# Patient Record
Sex: Female | Born: 1970 | Race: White | Hispanic: No | Marital: Married | Smoking: Former smoker
Health system: Southern US, Community
[De-identification: ages and names within clinical notes are randomized; demographics above are authoritative.]

## PROBLEM LIST (undated history)

## (undated) DIAGNOSIS — I10 Essential (primary) hypertension: Secondary | ICD-10-CM

## (undated) DIAGNOSIS — K219 Gastro-esophageal reflux disease without esophagitis: Secondary | ICD-10-CM

## (undated) DIAGNOSIS — K589 Irritable bowel syndrome without diarrhea: Secondary | ICD-10-CM

## (undated) HISTORY — DX: Irritable bowel syndrome, unspecified: K58.9

## (undated) HISTORY — PX: ABDOMINAL HYSTERECTOMY: SHX81

---

## 1994-12-01 HISTORY — PX: PARTIAL HYSTERECTOMY: SHX80

## 2004-11-28 ENCOUNTER — Ambulatory Visit: Payer: Self-pay

## 2004-12-05 ENCOUNTER — Ambulatory Visit: Payer: Self-pay

## 2005-12-02 ENCOUNTER — Ambulatory Visit: Payer: Self-pay

## 2007-10-27 ENCOUNTER — Ambulatory Visit: Payer: Self-pay

## 2007-11-19 ENCOUNTER — Ambulatory Visit: Payer: Self-pay | Admitting: Gastroenterology

## 2009-06-27 ENCOUNTER — Ambulatory Visit: Payer: Self-pay

## 2009-07-06 ENCOUNTER — Ambulatory Visit: Payer: Self-pay

## 2009-07-17 ENCOUNTER — Ambulatory Visit: Payer: Self-pay

## 2010-01-08 ENCOUNTER — Ambulatory Visit: Payer: Self-pay | Admitting: General Surgery

## 2011-05-30 ENCOUNTER — Ambulatory Visit: Payer: Self-pay | Admitting: Gastroenterology

## 2011-05-30 HISTORY — PX: UPPER GI ENDOSCOPY: SHX6162

## 2011-05-30 HISTORY — PX: COLONOSCOPY W/ BIOPSIES: SHX1374

## 2011-06-03 LAB — PATHOLOGY REPORT

## 2011-07-17 ENCOUNTER — Ambulatory Visit: Payer: Self-pay

## 2012-11-22 ENCOUNTER — Ambulatory Visit: Payer: Self-pay

## 2013-09-27 ENCOUNTER — Ambulatory Visit: Payer: Self-pay

## 2013-10-06 ENCOUNTER — Emergency Department (HOSPITAL_COMMUNITY): Payer: Managed Care, Other (non HMO)

## 2013-10-06 ENCOUNTER — Encounter (HOSPITAL_COMMUNITY): Payer: Self-pay | Admitting: Emergency Medicine

## 2013-10-06 ENCOUNTER — Emergency Department (HOSPITAL_COMMUNITY)
Admission: EM | Admit: 2013-10-06 | Discharge: 2013-10-06 | Disposition: A | Discharge status: Home / Self Care | Payer: Managed Care, Other (non HMO) | Attending: Emergency Medicine | Admitting: Emergency Medicine

## 2013-10-06 DIAGNOSIS — Z8639 Personal history of other endocrine, nutritional and metabolic disease: Secondary | ICD-10-CM | POA: Insufficient documentation

## 2013-10-06 DIAGNOSIS — I10 Essential (primary) hypertension: Secondary | ICD-10-CM | POA: Insufficient documentation

## 2013-10-06 DIAGNOSIS — K219 Gastro-esophageal reflux disease without esophagitis: Secondary | ICD-10-CM | POA: Insufficient documentation

## 2013-10-06 DIAGNOSIS — Z79899 Other long term (current) drug therapy: Secondary | ICD-10-CM | POA: Insufficient documentation

## 2013-10-06 DIAGNOSIS — R1013 Epigastric pain: Secondary | ICD-10-CM | POA: Insufficient documentation

## 2013-10-06 DIAGNOSIS — Z862 Personal history of diseases of the blood and blood-forming organs and certain disorders involving the immune mechanism: Secondary | ICD-10-CM | POA: Insufficient documentation

## 2013-10-06 DIAGNOSIS — R0789 Other chest pain: Secondary | ICD-10-CM | POA: Insufficient documentation

## 2013-10-06 HISTORY — DX: Gastro-esophageal reflux disease without esophagitis: K21.9

## 2013-10-06 HISTORY — DX: Essential (primary) hypertension: I10

## 2013-10-06 LAB — CBC
HCT: 40.1 % (ref 36.0–46.0)
Hemoglobin: 13.3 g/dL (ref 12.0–15.0)
MCH: 27.6 pg (ref 26.0–34.0)
MCHC: 33.2 g/dL (ref 30.0–36.0)
MCV: 83.2 fL (ref 78.0–100.0)
Platelets: 330 10*3/uL (ref 150–400)
RBC: 4.82 MIL/uL (ref 3.87–5.11)
RDW: 14 % (ref 11.5–15.5)
WBC: 11.8 10*3/uL — ABNORMAL HIGH (ref 4.0–10.5)

## 2013-10-06 LAB — BASIC METABOLIC PANEL
BUN: 16 mg/dL (ref 6–23)
CO2: 28 mEq/L (ref 19–32)
Calcium: 9.9 mg/dL (ref 8.4–10.5)
Chloride: 96 mEq/L (ref 96–112)
Creatinine, Ser: 0.55 mg/dL (ref 0.50–1.10)
GFR calc Af Amer: >90 mL/min (ref >90)
GFR calc non Af Amer: >90 mL/min (ref >90)
Glucose, Bld: 126 mg/dL — ABNORMAL HIGH (ref 70–99)
Potassium: 3.3 mEq/L — ABNORMAL LOW (ref 3.5–5.1)
Sodium: 135 mEq/L (ref 135–145)

## 2013-10-06 LAB — HEPATIC FUNCTION PANEL
Bilirubin, Direct: 0.1 mg/dL (ref 0.0–0.3)
Indirect Bilirubin: 0.2 mg/dL — ABNORMAL LOW (ref 0.3–0.9)

## 2013-10-06 LAB — POCT I-STAT TROPONIN I
Troponin i, poc: 0.01 ng/mL (ref 0.00–0.08)
Troponin i, poc: 0 ng/mL (ref 0.00–0.08)

## 2013-10-06 MED ORDER — GI COCKTAIL ~~LOC~~
30.0000 mL | Freq: Once | ORAL | Status: AC
  Administered 2013-10-06: 30 mL via ORAL
  Filled 2013-10-06: qty 30

## 2013-10-06 MED ORDER — ASPIRIN 81 MG PO CHEW
324.0000 mg | CHEWABLE_TABLET | Freq: Once | ORAL | Status: AC
  Administered 2013-10-06: 324 mg via ORAL
  Filled 2013-10-06: qty 4

## 2013-10-06 MED ORDER — HYDROCODONE-ACETAMINOPHEN 5-325 MG PO TABS: 1.0000 | ORAL_TABLET | ORAL | Status: DC | PRN

## 2013-10-06 MED ORDER — NITROGLYCERIN 0.4 MG SL SUBL
0.4000 mg | SUBLINGUAL_TABLET | SUBLINGUAL | Status: DC | PRN
  Administered 2013-10-06 (×2): 0.4 mg via SUBLINGUAL
  Filled 2013-10-06: qty 25

## 2013-10-06 MED ORDER — MORPHINE SULFATE 4 MG/ML IJ SOLN
4.0000 mg | Freq: Once | INTRAMUSCULAR | Status: AC
  Administered 2013-10-06: 4 mg via INTRAVENOUS
  Filled 2013-10-06: qty 1

## 2013-10-06 MED ORDER — MORPHINE SULFATE 2 MG/ML IJ SOLN
2.0000 mg | Freq: Once | INTRAMUSCULAR | Status: AC
  Administered 2013-10-06: 2 mg via INTRAVENOUS
  Filled 2013-10-06: qty 1

## 2013-10-06 NOTE — ED Notes (Signed)
Pt. reports mid chest pain onset last night with nausea , denies SOB or diaphoresis .

## 2013-10-06 NOTE — ED Provider Notes (Signed)
Medical screening examination/treatment/procedure(s) were performed by non-physician practitioner and as supervising physician I was immediately available for consultation/collaboration.  EKG Interpretation     Ventricular Rate:  107 PR Interval:  136 QRS Duration: 90 QT Interval:  360 QTC Calculation: 480 R Axis:   39 Text Interpretation:  Sinus tachycardia poor r  wave progression small inferior q waves Non-specific ST-t changes             Raeford Razor, MD 10/06/13 [phone]

## 2013-10-06 NOTE — ED Notes (Signed)
Pt has some relief from the morphine but the nitro did not relieve any of the pain she was saving

## 2013-10-06 NOTE — ED Provider Notes (Signed)
CSN: [id number]     Arrival date & time 10/06/13  0112 History   First MD Initiated Contact with Patient 10/06/13 0134     Chief Complaint  Patient presents with  . Chest Pain   HPI  History provided by the patient and husband. Patient is a 42 year old female with history of hypertension, hypertriglyceridemia, and GERD who presents with complaints of persistent chest pain. Patient first began having chest tightness and burning sensation in her substernal area around 8 PM yesterday evening. Patient was trying to rest but could not get comfortable trying to sleep this morning. Patient did have a glass of milk which cause worsening symptoms. She did not use any other treatments for her symptoms. The pain does radiate in a wrap around to the back bilaterally. Pain feels different from reflux symptoms. She denies any sour burning taste in the throat and mouth. No belching. Denies any abdominal pain or discomfort. There is no associated nausea or vomiting. No associated shortness of breath or pleuritic pain. No cough or hemoptysis. No recent travel. No pain or swelling in extremities. No prior history of DVT or PE. No estrogen use. No history of cancer.    Past Medical History  Diagnosis Date  . Hypertension   . GERD (gastroesophageal reflux disease)    Past Surgical History  Procedure Laterality Date  . Partial hysterectomy     No family history on file. History  Substance Use Topics  . Smoking status: Never Smoker   . Smokeless tobacco: Not on file  . Alcohol Use: No   OB History   Grav Para Term Preterm Abortions TAB SAB Ect Mult Living                 Review of Systems  Constitutional: Negative for fever and diaphoresis.  Respiratory: Negative for cough and shortness of breath.   Cardiovascular: Positive for chest pain. Negative for leg swelling.  Gastrointestinal: Negative for nausea, vomiting, abdominal pain, diarrhea and constipation.  All other systems reviewed and are  negative.    Allergies  Review of patient's allergies indicates no known allergies.  Home Medications   Current Outpatient Rx  Name  Route  Sig  Dispense  Refill  . B Complex-Biotin-FA (B-COMPLEX PO)   Oral   Take 1 tablet by mouth daily.         Marland Kitchen dexlansoprazole (DEXILANT) 60 MG capsule   Oral   Take 60 mg by mouth daily.         . hydrochlorothiazide (HYDRODIURIL) 25 MG tablet   Oral   Take 25 mg by mouth daily.         Marland Kitchen loratadine-pseudoephedrine (CLARITIN-D 12-HOUR) 5-120 MG per tablet   Oral   Take 1 tablet by mouth daily.         . Multiple Vitamin (MULTIVITAMIN WITH MINERALS) TABS tablet   Oral   Take 1 tablet by mouth daily.         . Probiotic Product (PROBIOTIC DAILY PO)   Oral   Take 1 tablet by mouth daily.          BP 158/105  Pulse 100  Temp(Src) 98.6 F (37 C) (Oral)  Resp 18  Ht 4\' 11"  (1.499 m)  Wt 213 lb 6.4 oz (96.798 kg)  BMI 43.08 kg/m2  SpO2 99% Physical Exam  Nursing note and vitals reviewed. Constitutional: She is oriented to person, place, and time. She appears well-developed and well-nourished. No distress.  HENT:  Head:  Normocephalic and atraumatic.  Eyes: Conjunctivae are normal.  Cardiovascular: Normal rate and regular rhythm.   No murmur heard. Pulmonary/Chest: Effort normal and breath sounds normal. No respiratory distress. She has no wheezes. She has no rales.  Abdominal: Soft. There is tenderness in the epigastric area. There is no rebound, no guarding and negative Murphy's sign.  Very mild epigastric tenderness. This does not significantly reproduce symptoms.  Musculoskeletal: Normal range of motion. She exhibits no edema and no tenderness.  No clinical signs concerning for DVT  Neurological: She is alert and oriented to person, place, and time.  Skin: Skin is warm and dry. No rash noted.  Psychiatric: She has a normal mood and affect. Her behavior is normal.    ED Course  Procedures  DIAGNOSTIC  STUDIES: Oxygen Saturation is 99% on room air.    COORDINATION OF CARE:  Nursing notes reviewed. Vital signs reviewed. Initial pt interview and examination performed.   2:03 AM-patient seen and evaluated. She is well appearing in no acute distress. Symptoms have been persistent for the past 4-5 hours. Patient is PERC negative. Discussed work up plan with pt at bedside, which includes lab testing, EKG and chest x-ray . Pt agrees with plan.   Initial EKG and troponin negative.  Chest x-ray unremarkable.  Second troponin negative. Patient did have relief of pain after second dose of morphine.  Pt discussed with Attending Physician who agrees with work up and continued outpatient evaluation and treatment.  Results for orders placed during the hospital encounter of 10/06/13  POCT I-STAT TROPONIN I      Result Value Range   Troponin i, poc 0.01  0.00 - 0.08 ng/mL   Comment 3               Imaging Review Dg Chest 2 View  10/06/2013   CLINICAL DATA:  Chest pain and tightness for the past week.  EXAM: CHEST  2 VIEW  COMPARISON:  No priors.  FINDINGS: Lung volumes are normal. No consolidative airspace disease. No pleural effusions. No pneumothorax. No pulmonary nodule or mass noted. Pulmonary vasculature and the cardiomediastinal silhouette are within normal limits.  IMPRESSION: 1.  No radiographic evidence of acute cardiopulmonary disease.   Electronically Signed   By: Trudie Reed M.D.   On: 10/06/2013 01:45    EKG Interpretation     Ventricular Rate:  107 PR Interval:  136 QRS Duration: 90 QT Interval:  360 QTC Calculation: 480 R Axis:   39 Text Interpretation:  Sinus tachycardia poor r  wave progression small inferior q waves Non-specific ST-t changes             Date: 10/06/2013  Rate: 107  Rhythm: sinus tachycardia  QRS Axis: normal  Intervals: normal  ST/T Wave abnormalities: normal  Conduction Disutrbances:none  Narrative Interpretation: Slight Q waves in  inferior leads  Old EKG Reviewed: none available     MDM   1. Atypical chest pain        Angus Seller, PA-C 10/06/13 [phone]

## 2013-10-10 ENCOUNTER — Ambulatory Visit: Payer: Self-pay

## 2013-12-08 ENCOUNTER — Encounter: Payer: Self-pay | Admitting: General Surgery

## 2013-12-08 ENCOUNTER — Ambulatory Visit (INDEPENDENT_AMBULATORY_CARE_PROVIDER_SITE_OTHER): Payer: Medicare HMO | Admitting: General Surgery

## 2013-12-08 VITALS — BP 152/80 | HR 72 | Resp 12 | Ht 59.0 in | Wt 211.0 lb

## 2013-12-08 DIAGNOSIS — K8020 Calculus of gallbladder without cholecystitis without obstruction: Secondary | ICD-10-CM

## 2013-12-08 NOTE — Patient Instructions (Addendum)

## 2013-12-08 NOTE — Progress Notes (Addendum)
Patient ID: Gloria Baker, female   DOB: Dec 30, 1970, 43 y.o.   MRN: [id number]  Chief Complaint  Patient presents with  . Abdominal Pain    gallstones    HPI Gloria Baker is a 43 y.o. female.  Here today for evaluation of gallstones referred by Dr Gloria Baker. Ultrasound done 10-10-13.  States the abdominal pain comes and goes.  Back in November when she went to the ER [facility]) when she was having severe pain (through to back) and up in chest and it lasted several hours. She describes having no improvement in her pain with use of nitroglycerin or the GI cocktail. Cardiac evaluation was negative.  She states there is a burning sensation and sometimes a pressure.  Symptoms are worse when she over eats. The pain occurs maybe twice in a week.  She started weight watchers and it has helped. Doesn't think its any specific food that triggers the pain its more of a volume issues. She does admit that salads can bother her.  Recently evaluated by Dr. Lady Gary for rapid heart rate, with no recurrent symptoms since initiating beta blocker therapy on 11-28-13.  HPI  Past Medical History  Diagnosis Date  . Hypertension   . GERD (gastroesophageal reflux disease)   . Irritable bowel syndrome     Past Surgical History  Procedure Laterality Date  . Partial hysterectomy  1996    Family History  Problem Relation Age of Onset  . Hypertension Father   . Diabetes Mother     Social History History  Substance Use Topics  . Smoking status: Former Smoker -- 2 years    Quit date: 12/02/1987  . Smokeless tobacco: Not on file  . Alcohol Use: No    No Known Allergies  Current Outpatient Prescriptions  Medication Sig Dispense Refill  . atenolol (TENORMIN) 25 MG tablet Take 25 mg by mouth daily.      . B Complex-Biotin-FA (B-COMPLEX PO) Take 1 tablet by mouth daily.      Marland Kitchen dexlansoprazole (DEXILANT) 60 MG capsule Take 60 mg by mouth daily.      Marland Kitchen loratadine-pseudoephedrine (CLARITIN-D 12-HOUR)  5-120 MG per tablet Take 1 tablet by mouth daily.      . Multiple Vitamin (MULTIVITAMIN WITH MINERALS) TABS tablet Take 1 tablet by mouth daily.       No current facility-administered medications for this visit.    Review of Systems Review of Systems  Constitutional: Negative.   Respiratory: Negative.   Cardiovascular: Negative.   Gastrointestinal: Positive for nausea and abdominal pain.    Blood pressure 152/80, pulse 72, resp. rate 12, height 4\' 11"  (1.499 m), weight 211 lb (95.709 kg).  Physical Exam Physical Exam  Constitutional: She is oriented to person, place, and time. She appears well-developed and well-nourished.  Eyes: No scleral icterus.  Neck: Neck supple.  Cardiovascular: Normal rate, regular rhythm and normal heart sounds.   Pulmonary/Chest: Effort normal and breath sounds normal.  Abdominal: Soft. Normal appearance and bowel sounds are normal. She exhibits no mass. There is tenderness in the right lower quadrant and left lower quadrant. No hernia.  Lymphadenopathy:    She has no cervical adenopathy.  Neurological: She is alert and oriented to person, place, and time.  Skin: Skin is warm and dry.    Data Reviewed Abdominal ultrasound dated October 10, 2013 showed a contracted gallbladder with multiple stones. Common bile duct 4.2 mm. Liver findings suggestive of hepatic steatosis or fatty infiltration.  Holter monitor obtained  by Dr. Lady Baker September 27, 2013 showed several episodes of SVT with rates to 150. No sustained V. Tach.  Laboratory studies his September 22, 2013 showed normal basic metabolic panel. Hemoglobin A1c of 5.9.  Gloria Baker notes reviewed: CBC was notable for a mild elevation of 11,800, normal hemoglobin, troponin, lipase and comprehensive metabolic panel. (Potassium 3.3) chest x-ray showed no acute cardiopulmonary disease.  Assessment    Symptomatic cholelithiasis.  Obesity.  History SVT, well controlled with beta blocker therapy.      Plan    Laparoscopic Cholecystectomy with Intraoperative Cholangiogram. The procedure, including it's potential risks and complications (including but not limited to infection, bleeding, injury to intra-abdominal organs or bile ducts, bile leak, poor cosmetic result, sepsis and death) were discussed with the patient in detail. Non-operative options, including their inherent risks (acute calculous cholecystitis with possible choledocholithiasis or gallstone pancreatitis, with the risk of ascending cholangitis, sepsis, and death) were discussed as well. The patient expressed and understanding of what we discussed and wishes to proceed with laparoscopic cholecystectomy. The patient further understands that if it is technically not possible, or it is unsafe to proceed laparoscopically, that I will convert to an open cholecystectomy.    Patient's surgery has been scheduled for 01-06-14 at [facility].   Gloria Baker, Gloria Baker 12/10/2013, 7:06 AM

## 2013-12-10 ENCOUNTER — Encounter: Payer: Self-pay | Admitting: General Surgery

## 2013-12-10 ENCOUNTER — Other Ambulatory Visit: Payer: Self-pay | Admitting: General Surgery

## 2013-12-10 DIAGNOSIS — K8020 Calculus of gallbladder without cholecystitis without obstruction: Secondary | ICD-10-CM | POA: Insufficient documentation

## 2013-12-12 ENCOUNTER — Ambulatory Visit: Payer: Self-pay

## 2013-12-29 ENCOUNTER — Ambulatory Visit: Payer: Self-pay | Admitting: General Surgery

## 2013-12-29 ENCOUNTER — Telehealth: Payer: Self-pay | Admitting: *Deleted

## 2013-12-29 ENCOUNTER — Encounter: Payer: Self-pay | Admitting: General Surgery

## 2013-12-29 LAB — POTASSIUM: Potassium: 4.1 mmol/L (ref 3.5–5.1)

## 2013-12-29 NOTE — Telephone Encounter (Signed)
Per Gloria Baker in Pre-admit, patient came in today and had her potassium checked. She did complain that she has had a recent episode of her heart racing. She sees Dr. Lady GaryFath and was seen by him yesterday. Gloria Baker will call their office and have them fax records to Pre-admit as well as to our office.   Patient's surgery is scheduled for next Friday, 01-06-14.

## 2014-01-06 ENCOUNTER — Ambulatory Visit: Payer: Self-pay | Admitting: General Surgery

## 2014-01-06 ENCOUNTER — Encounter: Payer: Self-pay | Admitting: General Surgery

## 2014-01-06 DIAGNOSIS — K8010 Calculus of gallbladder with chronic cholecystitis without obstruction: Secondary | ICD-10-CM

## 2014-01-06 HISTORY — PX: CHOLECYSTECTOMY: SHX55

## 2014-01-10 LAB — PATHOLOGY REPORT

## 2014-01-11 ENCOUNTER — Encounter: Payer: Self-pay | Admitting: General Surgery

## 2014-01-17 ENCOUNTER — Encounter: Payer: Self-pay | Admitting: General Surgery

## 2014-01-17 ENCOUNTER — Ambulatory Visit: Payer: Medicare HMO | Admitting: General Surgery

## 2014-01-17 ENCOUNTER — Ambulatory Visit (INDEPENDENT_AMBULATORY_CARE_PROVIDER_SITE_OTHER): Payer: Medicare HMO | Admitting: General Surgery

## 2014-01-17 VITALS — BP 152/92 | HR 84 | Resp 12 | Ht 59.0 in | Wt 211.0 lb

## 2014-01-17 DIAGNOSIS — K8020 Calculus of gallbladder without cholecystitis without obstruction: Secondary | ICD-10-CM

## 2014-01-17 NOTE — Patient Instructions (Signed)
Patient to return as needed. Patient to call with any questions or concerns.

## 2014-01-17 NOTE — Progress Notes (Signed)
This is a 43 year old female here today for her post op gallbladder done on 01/06/14. The patient states she has a pinching sensation when she bends over. Otherwise doing well. The bowels are doing well, possibly slightly more frequent than before surgery.  The patient has appreciated some pulling sensation in the right upper quadrant when she bends at the waist such as tying her shoes or reaching behind her to the right.  Transient discomfort, not interfering with other activities.  Chest: Clear breath sounds.  Cardio: Regular rhythm.  Abdomen: Soft, nontender.  Incision sites healing well, with slight thickening secondary to subcuticular sutures.   Pathology showed cholelithiasis and cholesterolosis.  Patient to return as needed.

## 2014-10-02 ENCOUNTER — Encounter: Payer: Self-pay | Admitting: General Surgery

## 2015-03-05 ENCOUNTER — Ambulatory Visit: Admit: 2015-03-05 | Disposition: A | Discharge status: Home / Self Care | Payer: Self-pay

## 2015-03-24 NOTE — Op Note (Signed)
PATIENT NAME:  Gloria Baker, [name] MR#:  [id number] DATE OF BIRTH:  15-Jan-1971  DATE OF PROCEDURE:  01/06/2014  PREOPERATIVE DIAGNOSIS: Chronic cholecystitis and cholelithiasis.   POSTOPERATIVE DIAGNOSIS: Chronic cholecystitis and cholelithiasis.   OPERATIVE PROCEDURE: Laparoscopic cholecystectomy with attempted cholangiograms.   SURGEON: Donnalee CurryJeffrey Jadamarie Butson, MD.   ANESTHESIA: General endotracheal under Dr. Ronalee BeltsBhandari.   ESTIMATED BLOOD LOSS: Less than 5 mL.   CLINICAL NOTE: This 44 year old woman has had a long history of episodic abdominal pain. She was recently identified with gallstones and was felt to be a candidate for elective cholecystectomy.   OPERATIVE NOTE: With the patient under adequate general endotracheal anesthesia, the abdomen was prepped with ChloraPrep and draped. The patient was placed into reverse Trendelenburg position. A Veress needle was placed through a transumbilical incision. After assuring intra-abdominal location with the hanging drop test, the abdomen was insufflated with CO2 at 10 mmHg pressure. This was followed by placement of a 10 mm step port. Inspection showed no evidence of injury from initial port placement. The patient was placed in reverse Trendelenburg position and rolled to the left. There was noted to be marked adhesions of the omentum to the liver overlying the top of the gallbladder, but few adhesions between the gallbladder and the omentum. An 11 mm Xcel port was placed in the epigastrium and two 5 mm step ports placed in the lateral abdomen. The omental adhesions were taken down with hemostasis with electrocautery. The gallbladder was placed on cephalad traction. The duodenum was adherent to the neck of the gallbladder and this was gently dissected with a Kittner clamp. After exposing the neck of the gallbladder, a fifth port was placed in the mid axillary line on the right side and a Peer retractor used to distract the duodenum medially and inferiorly to allow  better exposure. A Kumar clamp was placed and attempted cholangiograms were undertaken. There was reflux of the contrast into the gallbladder and attempts to milk a suspected stone from the neck of the gallbladder were unsuccessful. The catheter was removed. The cystic duct and cystic artery were exposed to a bare liver plate and the cystic duct doubly clipped and divided. Similar procedure was used on the cystic artery.   The gallbladder was then removed from the liver bed making use of hook cautery dissection. A rent in the gallbladder released at least one 8 mm stone, which was immediately retrieved. The patient received Kefzol this time. The gallbladder was placed in an Endo Catch bag and then delivered through the umbilical port site. Multiple 6 mm stones were identified. The right upper quadrant was irrigated with lactated Ringer's solution. A few remaining tiny stone fragments were retrieved. There was good hemostasis. The abdomen was then desufflated and ports removed after inspection on the umbilical site from the epigastric area with again no evidence of injury. Skin incisions were closed with 4-0 Vicryl subcuticular suture. Benzoin, Steri-Strips, Telfa, and Tegaderm dressings were then applied. The patient tolerated the procedure well and was taken to the recovery room in stable condition.   ____________________________ Earline MayotteJeffrey W. Leroy Pettway, MD jwb:aw D: 01/06/2014 09:57:44 ET T: 01/06/2014 10:09:46 ET JOB#: [id number]  cc: Earline MayotteJeffrey W. Iisha Soyars, MD, <Dictator> Marcine Matarharles W. Phillips Jr., MD Janmichael Giraud Brion AlimentW Darran Gabay MD ELECTRONICALLY SIGNED 01/11/2014 10:30

## 2016-05-14 ENCOUNTER — Other Ambulatory Visit: Payer: Self-pay | Admitting: Family Medicine

## 2016-05-14 DIAGNOSIS — Z1231 Encounter for screening mammogram for malignant neoplasm of breast: Secondary | ICD-10-CM

## 2016-05-30 ENCOUNTER — Ambulatory Visit
Admission: RE | Admit: 2016-05-30 | Discharge: 2016-05-30 | Disposition: A | Discharge status: Home / Self Care | Payer: BLUE CROSS/BLUE SHIELD | Source: Ambulatory Visit | Attending: Family Medicine | Admitting: Family Medicine

## 2016-05-30 ENCOUNTER — Other Ambulatory Visit: Payer: Self-pay | Admitting: Family Medicine

## 2016-05-30 DIAGNOSIS — Z1231 Encounter for screening mammogram for malignant neoplasm of breast: Secondary | ICD-10-CM | POA: Diagnosis not present

## 2016-07-18 ENCOUNTER — Other Ambulatory Visit
Admission: RE | Admit: 2016-07-18 | Discharge: 2016-07-18 | Disposition: A | Discharge status: Home / Self Care | Payer: Managed Care, Other (non HMO) | Source: Ambulatory Visit | Attending: Gastroenterology | Admitting: Gastroenterology

## 2016-07-18 DIAGNOSIS — R1032 Left lower quadrant pain: Secondary | ICD-10-CM | POA: Insufficient documentation

## 2016-07-18 DIAGNOSIS — R197 Diarrhea, unspecified: Secondary | ICD-10-CM | POA: Insufficient documentation

## 2016-07-18 DIAGNOSIS — R1031 Right lower quadrant pain: Secondary | ICD-10-CM | POA: Insufficient documentation

## 2016-07-18 LAB — GASTROINTESTINAL PANEL BY PCR, STOOL (REPLACES STOOL CULTURE)
Adenovirus F40/41: NOT DETECTED
Astrovirus: NOT DETECTED
CRYPTOSPORIDIUM: NOT DETECTED
CYCLOSPORA CAYETANENSIS: NOT DETECTED
Campylobacter species: NOT DETECTED
E. COLI O157: NOT DETECTED
ENTAMOEBA HISTOLYTICA: NOT DETECTED
ENTEROAGGREGATIVE E COLI (EAEC): NOT DETECTED
Enteropathogenic E coli (EPEC): NOT DETECTED
Enterotoxigenic E coli (ETEC): NOT DETECTED
GIARDIA LAMBLIA: NOT DETECTED
NOROVIRUS GI/GII: NOT DETECTED
Plesimonas shigelloides: NOT DETECTED
Rotavirus A: NOT DETECTED
SALMONELLA SPECIES: NOT DETECTED
SAPOVIRUS (I, II, IV, AND V): NOT DETECTED
SHIGELLA/ENTEROINVASIVE E COLI (EIEC): NOT DETECTED
Shiga like toxin producing E coli (STEC): NOT DETECTED
VIBRIO CHOLERAE: NOT DETECTED
VIBRIO SPECIES: NOT DETECTED
YERSINIA ENTEROCOLITICA: NOT DETECTED

## 2016-07-18 LAB — C DIFFICILE QUICK SCREEN W PCR REFLEX
C DIFFICILE (CDIFF) INTERP: NOT DETECTED
C Diff antigen: NEGATIVE
C Diff toxin: NEGATIVE

## 2016-10-16 ENCOUNTER — Encounter: Payer: Self-pay | Admitting: *Deleted

## 2016-10-17 ENCOUNTER — Encounter
Admission: RE | Disposition: A | Discharge status: Home / Self Care | Payer: Self-pay | Source: Ambulatory Visit | Attending: Gastroenterology

## 2016-10-17 ENCOUNTER — Ambulatory Visit
Admission: RE | Admit: 2016-10-17 | Discharge: 2016-10-17 | Disposition: A | Discharge status: Home / Self Care | Payer: BLUE CROSS/BLUE SHIELD | Source: Ambulatory Visit | Attending: Gastroenterology | Admitting: Gastroenterology

## 2016-10-17 ENCOUNTER — Ambulatory Visit: Payer: BLUE CROSS/BLUE SHIELD | Admitting: Anesthesiology

## 2016-10-17 ENCOUNTER — Encounter: Payer: Self-pay | Admitting: *Deleted

## 2016-10-17 DIAGNOSIS — K580 Irritable bowel syndrome with diarrhea: Secondary | ICD-10-CM | POA: Diagnosis not present

## 2016-10-17 DIAGNOSIS — Z87891 Personal history of nicotine dependence: Secondary | ICD-10-CM | POA: Diagnosis not present

## 2016-10-17 DIAGNOSIS — K2950 Unspecified chronic gastritis without bleeding: Secondary | ICD-10-CM | POA: Diagnosis not present

## 2016-10-17 DIAGNOSIS — I10 Essential (primary) hypertension: Secondary | ICD-10-CM | POA: Insufficient documentation

## 2016-10-17 DIAGNOSIS — K317 Polyp of stomach and duodenum: Secondary | ICD-10-CM | POA: Diagnosis not present

## 2016-10-17 DIAGNOSIS — R1013 Epigastric pain: Secondary | ICD-10-CM | POA: Diagnosis present

## 2016-10-17 DIAGNOSIS — K210 Gastro-esophageal reflux disease with esophagitis: Secondary | ICD-10-CM | POA: Diagnosis not present

## 2016-10-17 DIAGNOSIS — K3189 Other diseases of stomach and duodenum: Secondary | ICD-10-CM | POA: Diagnosis not present

## 2016-10-17 DIAGNOSIS — K5730 Diverticulosis of large intestine without perforation or abscess without bleeding: Secondary | ICD-10-CM | POA: Insufficient documentation

## 2016-10-17 HISTORY — PX: COLONOSCOPY WITH PROPOFOL: SHX5780

## 2016-10-17 HISTORY — PX: ESOPHAGOGASTRODUODENOSCOPY (EGD) WITH PROPOFOL: SHX5813

## 2016-10-17 SURGERY — ESOPHAGOGASTRODUODENOSCOPY (EGD) WITH PROPOFOL
Anesthesia: General

## 2016-10-17 MED ORDER — SODIUM CHLORIDE 0.9 % IV SOLN: INTRAVENOUS | Status: DC

## 2016-10-17 MED ORDER — PROPOFOL 10 MG/ML IV BOLUS
INTRAVENOUS | Status: DC | PRN
  Administered 2016-10-17: 80 mg via INTRAVENOUS

## 2016-10-17 MED ORDER — PROPOFOL 500 MG/50ML IV EMUL
INTRAVENOUS | Status: DC | PRN
  Administered 2016-10-17: 120 ug/kg/min via INTRAVENOUS

## 2016-10-17 MED ORDER — FENTANYL CITRATE (PF) 100 MCG/2ML IJ SOLN
INTRAMUSCULAR | Status: DC | PRN
  Administered 2016-10-17: 50 ug via INTRAVENOUS

## 2016-10-17 MED ORDER — MIDAZOLAM HCL 2 MG/2ML IJ SOLN
INTRAMUSCULAR | Status: DC | PRN
  Administered 2016-10-17: 1 mg via INTRAVENOUS

## 2016-10-17 MED ORDER — SODIUM CHLORIDE 0.9 % IV SOLN
INTRAVENOUS | Status: DC
  Administered 2016-10-17: 1000 mL via INTRAVENOUS

## 2016-10-17 NOTE — H&P (Addendum)
Outpatient short stay form Pre-procedure 10/17/2016 7:43 AM Gloria Baker Lacrisha Bielicki MD  Primary Physician: Dr. Emmaline KluverJames Headrick  Reason for visit:  EGD and colonoscopy  History of present illness:  Patient is a 45 year old female presenting today as above. She has had increasing problems over the past 6 or 8 months of diarrhea lower abdominal pain and bowel habit change. She has had her gallbladder removed several years ago and this is likely issue with some: Reticulocyte component. He also has a history of GERD. She does take a proton pump inhibitor regularly however she is been taking it after meals or in the evening before she goes to bed. We discussed appropriate timing of medication. She tolerated her prep well. She states she is having a clear bowel movement currently. She denies use of any aspirin or blood thinning agent.    Current Facility-Administered Medications:  .  0.9 %  sodium chloride infusion, , Intravenous, Continuous, Gloria Baker Juleen Sorrels, MD, Last Rate: 20 mL/hr at 10/17/16 0735, 1,000 mL at 10/17/16 0735 .  0.9 %  sodium chloride infusion, , Intravenous, Continuous, Gloria Baker Chevella Pearce, MD  Prescriptions Prior to Admission  Medication Sig Dispense Refill Last Dose  . atenolol (TENORMIN) 25 MG tablet Take 25 mg by mouth daily.   10/17/2016 at 0430  . B Complex-Biotin-FA (B-COMPLEX PO) Take 1 tablet by mouth daily.   Taking  . esomeprazole (NEXIUM) 40 MG capsule Take 40 mg by mouth daily at 12 noon.   10/16/2016 at Unknown time  . hydrochlorothiazide (HYDRODIURIL) 12.5 MG tablet Take 12.5 mg by mouth daily.   10/17/2016 at 0430  . Multiple Vitamin (MULTIVITAMIN WITH MINERALS) TABS tablet Take 1 tablet by mouth daily.   10/16/2016 at Unknown time  . SACCHAROMYCES BOULARDII PO Take by mouth daily.   10/16/2016 at Unknown time  . dexlansoprazole (DEXILANT) 60 MG capsule Take 60 mg by mouth daily.   Not Taking at Unknown time  . loratadine-pseudoephedrine (CLARITIN-D 12-HOUR) 5-120 MG per  tablet Take 1 tablet by mouth daily.   Taking     No Known Allergies   Past Medical History:  Diagnosis Date  . GERD (gastroesophageal reflux disease)   . Hypertension   . Irritable bowel syndrome     Review of systems:      Physical Exam    Heart and lungs: Regular rate and rhythm without rub or gallop, lungs are bilaterally clear.    HEENT: Normocephalic atraumatic eyes are anicteric    Other:     Pertinant exam for procedure: Soft nontender nondistended bowel sounds positive normoactive.    Planned proceedures: EGD and colonoscopy with indicated procedures. I have discussed the risks benefits and complications of procedures to include not limited to bleeding, infection, perforation and the risk of sedation and the patient wishes to proceed.    Gloria Baker Pleasant Bensinger, MD Gastroenterology 10/17/2016  7:43 AM

## 2016-10-17 NOTE — Anesthesia Preprocedure Evaluation (Signed)
Anesthesia Evaluation  Patient identified by MRN, date of birth, ID band Patient awake    Reviewed: Allergy & Precautions, NPO status , Patient's Chart, lab work & pertinent test results  History of Anesthesia Complications Negative for: history of anesthetic complications  Airway Mallampati: II       Dental   Pulmonary neg pulmonary ROS, former smoker,           Cardiovascular hypertension, Pt. on medications and Pt. on home beta blockers      Neuro/Psych negative neurological ROS     GI/Hepatic Neg liver ROS, GERD  Medicated and Controlled,  Endo/Other  negative endocrine ROS  Renal/GU negative Renal ROS     Musculoskeletal negative musculoskeletal ROS (+)   Abdominal   Peds  Hematology negative hematology ROS (+)   Anesthesia Other Findings   Reproductive/Obstetrics                             Anesthesia Physical Anesthesia Plan  ASA: II  Anesthesia Plan: General   Post-op Pain Management:    Induction: Intravenous  Airway Management Planned: Nasal Cannula  Additional Equipment:   Intra-op Plan:   Post-operative Plan:   Informed Consent: I have reviewed the patients History and Physical, chart, labs and discussed the procedure including the risks, benefits and alternatives for the proposed anesthesia with the patient or authorized representative who has indicated his/her understanding and acceptance.     Plan Discussed with:   Anesthesia Plan Comments:         Anesthesia Quick Evaluation

## 2016-10-17 NOTE — Op Note (Signed)
[facility] Gastroenterology Patient Name: Astrid DivineKristen Cotugno Procedure Date: 10/17/2016 7:43 AM MRN: [id number] Account #: [id number] Date of Birth: October 13, 1971 Admit Type: Outpatient Age: [id number] [facility] Gender: Female Note Status: Finalized Procedure:            Upper GI endoscopy Indications:          Epigastric abdominal pain, Diarrhea, Nausea Providers:            Christena DeemMartin U. Skulskie, MD Referring MD:         Raliegh Ipharles W. Phillips, MD (Referring MD) Medicines:            Monitored Anesthesia Care Complications:        No immediate complications. Procedure:            Pre-Anesthesia Assessment:                       - ASA Grade Assessment: II - A patient with mild                        systemic disease.                       After obtaining informed consent, the endoscope was                        passed under direct vision. Throughout the procedure,                        the patient's blood pressure, pulse, and oxygen                        saturations were monitored continuously. The Endoscope                        was introduced through the mouth, and advanced to the                        third part of duodenum. The patient tolerated the                        procedure well. Findings:      The Z-line was variable. Biopsies were taken with a cold forceps for       histology.      The exam of the esophagus was otherwise normal.      Diffuse mild inflammation characterized by congestion (edema) and       erythema was found in the gastric body. Biopsies were taken with a cold       forceps for histology.      A few 1 to 4 mm sessile polyps with no bleeding and no stigmata of       recent bleeding were found in the gastric body. Biopsies were taken with       a cold forceps for histology.      Diffuse mild mucosal variance characterized by flattening was found in       the entire duodenum. Biopsies were taken with a cold forceps for   histology.      The exam of the duodenum was otherwise normal.      The cardia and gastric fundus were normal on retroflexion. Impression:           -  Z-line variable. Biopsied.                       - Bile gastritis. Biopsied.                       - A few gastric polyps. Biopsied.                       - Mucosal variant in the duodenum. Biopsied. Recommendation:       - Perform a colonoscopy today. Procedure Code(s):    --- Professional ---                       [phone], Esophagogastroduodenoscopy, flexible, transoral;                        with biopsy, single or multiple Diagnosis Code(s):    --- Professional ---                       K22.8, Other specified diseases of esophagus                       K29.60, Other gastritis without bleeding                       K31.7, Polyp of stomach and duodenum                       K31.89, Other diseases of stomach and duodenum                       R10.13, Epigastric pain                       R19.7, Diarrhea, unspecified                       R11.0, Nausea CPT copyright 2016 American Medical Association. All rights reserved. The codes documented in this report are preliminary and upon coder review may  be revised to meet current compliance requirements. Christena DeemMartin U Skulskie, MD 10/17/2016 8:06:41 AM This report has been signed electronically. Number of Addenda: 0 Note Initiated On: 10/17/2016 7:43 AM      [facility]

## 2016-10-17 NOTE — Anesthesia Postprocedure Evaluation (Signed)
Anesthesia Post Note  Patient: Fara ChuteKristen M Garmon  Procedure(s) Performed: Procedure(s) (LRB): ESOPHAGOGASTRODUODENOSCOPY (EGD) WITH PROPOFOL (N/A) COLONOSCOPY WITH PROPOFOL (N/A)  Patient location during evaluation: Endoscopy Anesthesia Type: General Level of consciousness: awake and alert Pain management: pain level controlled Vital Signs Assessment: post-procedure vital signs reviewed and stable Respiratory status: spontaneous breathing and respiratory function stable Cardiovascular status: stable Anesthetic complications: no    Last Vitals:  Vitals:   10/17/16 0831 10/17/16 0843  BP: 128/73 124/85  Pulse: 81   Resp: 18   Temp: 36.4 C     Last Pain:  Vitals:   10/17/16 0831  TempSrc:   PainSc: 0-No pain                 KEPHART,WILLIAM K

## 2016-10-17 NOTE — Transfer of Care (Signed)
Immediate Anesthesia Transfer of Care Note  Patient: Gloria Baker  Procedure(s) Performed: Procedure(s): ESOPHAGOGASTRODUODENOSCOPY (EGD) WITH PROPOFOL (N/A) COLONOSCOPY WITH PROPOFOL (N/A)  Patient Location: PACU and Endoscopy Unit  Anesthesia Type:General  Level of Consciousness: patient cooperative and lethargic  Airway & Oxygen Therapy: Patient Spontanous Breathing and Patient connected to nasal cannula oxygen  Post-op Assessment: Report given to RN and Post -op Vital signs reviewed and stable  Post vital signs: Reviewed and stable  Last Vitals:  Vitals:   10/17/16 0829 10/17/16 0831  BP: 128/73 128/73  Pulse: 74 81  Resp: 18 18  Temp: 36.2 C 36.4 C    Last Pain:  Vitals:   10/17/16 0829  TempSrc: Tympanic  PainSc:          Complications: No apparent anesthesia complications

## 2016-10-17 NOTE — Op Note (Signed)
[facility] Gastroenterology Patient Name: Gloria Baker Procedure Date: 10/17/2016 7:43 AM MRN: [id number] Account #: [id number] Date of Birth: [birth year] Admit Type: Outpatient Age: [age] [facility] Gender: Female Note Status: Finalized Procedure:            Colonoscopy Indications:          Lower abdominal pain, Chronic diarrhea Providers:            Christena DeemMartin U. Ezeriah Luty, MD Referring MD:         Raliegh Ipharles W. Phillips, MD (Referring MD) Medicines:            Monitored Anesthesia Care Complications:        No immediate complications. Procedure:            Pre-Anesthesia Assessment:                       - ASA Grade Assessment: II - A patient with mild                        systemic disease.                       After obtaining informed consent, the colonoscope was                        passed under direct vision. Throughout the procedure,                        the patient's blood pressure, pulse, and oxygen                        saturations were monitored continuously. The                        Colonoscope was introduced through the anus and                        advanced to the the cecum, identified by appendiceal                        orifice and ileocecal valve. The colonoscopy was                        performed without difficulty. The patient tolerated the                        procedure well. The quality of the bowel preparation                        was good. Findings:      Multiple small-mouthed diverticula were found in the sigmoid colon,       descending colon and transverse colon.      The exam was otherwise without abnormality.      The retroflexed view of the distal rectum and anal verge was normal and       showed no anal or rectal abnormalities.      Biopsies for histology were taken with a cold forceps from the right       colon and left colon for evaluation of microscopic colitis.      The digital rectal exam was  normal.  Impression:           - Diverticulosis in the sigmoid colon, in the                        descending colon and in the transverse colon.                       - The examination was otherwise normal.                       - The distal rectum and anal verge are normal on                        retroflexion view.                       - Biopsies were taken with a cold forceps from the                        right colon and left colon for evaluation of                        microscopic colitis. Recommendation:       - Await pathology results.                       - Return to GI clinic in 4 weeks. Procedure Code(s):    --- Professional ---                       [phone] [id number], Colonoscopy, flexible; with biopsy, single or                        multiple Diagnosis Code(s):    --- Professional ---                       R10.30, Lower abdominal pain, unspecified                       K52.9, Noninfective gastroenteritis and colitis,                        unspecified                       K57.30, Diverticulosis of large intestine without                        perforation or abscess without bleeding CPT copyright 2016 American Medical Association. All rights reserved. The codes documented in this report are preliminary and upon coder review may  be revised to meet current compliance requirements. Christena DeemMartin U Anice Wilshire, MD 10/17/2016 8:28:40 AM This report has been signed electronically. Number of Addenda: 0 Note Initiated On: 10/17/2016 7:43 AM Scope Withdrawal Time: 0 hours 8 minutes 23 seconds  Total Procedure Duration: 0 hours 15 minutes 35 seconds       [facility]

## 2016-10-20 ENCOUNTER — Encounter: Payer: Self-pay | Admitting: Gastroenterology

## 2016-11-13 LAB — SURGICAL PATHOLOGY

## 2017-05-19 ENCOUNTER — Other Ambulatory Visit: Payer: Self-pay | Admitting: Family Medicine

## 2017-05-19 DIAGNOSIS — Z1231 Encounter for screening mammogram for malignant neoplasm of breast: Secondary | ICD-10-CM

## 2017-06-10 ENCOUNTER — Ambulatory Visit
Admission: RE | Admit: 2017-06-10 | Discharge: 2017-06-10 | Disposition: A | Discharge status: Home / Self Care | Payer: BLUE CROSS/BLUE SHIELD | Source: Ambulatory Visit | Attending: Family Medicine | Admitting: Family Medicine

## 2017-06-10 DIAGNOSIS — R928 Other abnormal and inconclusive findings on diagnostic imaging of breast: Secondary | ICD-10-CM | POA: Insufficient documentation

## 2017-06-10 DIAGNOSIS — Z1231 Encounter for screening mammogram for malignant neoplasm of breast: Secondary | ICD-10-CM | POA: Insufficient documentation

## 2017-06-15 ENCOUNTER — Other Ambulatory Visit: Payer: Self-pay | Admitting: Family Medicine

## 2017-06-15 DIAGNOSIS — N6310 Unspecified lump in the right breast, unspecified quadrant: Secondary | ICD-10-CM

## 2017-06-15 DIAGNOSIS — R928 Other abnormal and inconclusive findings on diagnostic imaging of breast: Secondary | ICD-10-CM

## 2017-06-25 ENCOUNTER — Ambulatory Visit: Payer: BLUE CROSS/BLUE SHIELD

## 2017-06-25 ENCOUNTER — Other Ambulatory Visit: Payer: BLUE CROSS/BLUE SHIELD

## 2017-06-26 ENCOUNTER — Ambulatory Visit
Admission: RE | Admit: 2017-06-26 | Discharge: 2017-06-26 | Disposition: A | Discharge status: Home / Self Care | Payer: BLUE CROSS/BLUE SHIELD | Source: Ambulatory Visit | Attending: Family Medicine | Admitting: Family Medicine

## 2017-06-26 DIAGNOSIS — N6310 Unspecified lump in the right breast, unspecified quadrant: Secondary | ICD-10-CM

## 2017-06-26 DIAGNOSIS — N6313 Unspecified lump in the right breast, lower outer quadrant: Secondary | ICD-10-CM | POA: Diagnosis not present

## 2017-06-26 DIAGNOSIS — R928 Other abnormal and inconclusive findings on diagnostic imaging of breast: Secondary | ICD-10-CM | POA: Diagnosis present

## 2017-12-10 ENCOUNTER — Other Ambulatory Visit: Payer: Self-pay | Admitting: Family Medicine

## 2017-12-10 DIAGNOSIS — N63 Unspecified lump in unspecified breast: Secondary | ICD-10-CM

## 2018-01-07 ENCOUNTER — Ambulatory Visit
Admission: RE | Admit: 2018-01-07 | Discharge: 2018-01-07 | Disposition: A | Discharge status: Home / Self Care | Payer: BLUE CROSS/BLUE SHIELD | Source: Ambulatory Visit | Attending: Family Medicine | Admitting: Family Medicine

## 2018-01-07 DIAGNOSIS — N63 Unspecified lump in unspecified breast: Secondary | ICD-10-CM | POA: Diagnosis present

## 2019-08-23 ENCOUNTER — Other Ambulatory Visit: Payer: Self-pay | Admitting: Family Medicine

## 2019-08-23 DIAGNOSIS — Z1231 Encounter for screening mammogram for malignant neoplasm of breast: Secondary | ICD-10-CM

## 2019-10-18 ENCOUNTER — Ambulatory Visit
Admission: RE | Admit: 2019-10-18 | Discharge: 2019-10-18 | Disposition: A | Discharge status: Home / Self Care | Payer: BC Managed Care – PPO | Source: Ambulatory Visit | Attending: Family Medicine | Admitting: Family Medicine

## 2019-10-18 DIAGNOSIS — Z1231 Encounter for screening mammogram for malignant neoplasm of breast: Secondary | ICD-10-CM | POA: Diagnosis present

## 2019-11-14 ENCOUNTER — Encounter: Payer: Self-pay | Admitting: Urology

## 2019-11-14 ENCOUNTER — Other Ambulatory Visit: Payer: Self-pay

## 2019-11-14 ENCOUNTER — Ambulatory Visit: Payer: BC Managed Care – PPO | Admitting: Urology

## 2019-11-14 VITALS — BP 138/82 | HR 86 | Ht 59.0 in | Wt 217.0 lb

## 2019-11-14 DIAGNOSIS — R350 Frequency of micturition: Secondary | ICD-10-CM

## 2019-11-14 DIAGNOSIS — N3281 Overactive bladder: Secondary | ICD-10-CM

## 2019-11-14 LAB — BLADDER SCAN AMB NON-IMAGING

## 2019-11-14 MED ORDER — OXYBUTYNIN CHLORIDE ER 10 MG PO TB24: 10.0000 mg | ORAL_TABLET | Freq: Every day | ORAL | 11 refills | Status: DC

## 2019-11-14 MED ORDER — TOLTERODINE TARTRATE ER 4 MG PO CP24: 4.0000 mg | ORAL_CAPSULE | Freq: Every day | ORAL | 11 refills | Status: AC

## 2019-11-14 NOTE — Progress Notes (Signed)
11/14/2019 2:02 PM   Gloria Baker August 27, 1971 [id number]  Referring provider: [place] Pink, MD [address] [place],  [place] [address]  Chief Complaint  Patient presents with  . Over Active Bladder    HPI: Was consulted to assist the patient is worsening frequency and urgency.  After fluid she can void every 15 minutes.  Depending on fluid she may be allowed for 2 hours.  She has rare incontinence with urgency.  She does better sitting.  If she had a bad cold she can leak with coughing and sneezing wearing a pad but otherwise no pads  She failed 2 medications including Myrbetriq  She reports a good flow and has no nocturia.  She feels empty.  No pelvic pain  No history of previous GU surgery kidney stones or bladder infections.  No neurologic issues.  Has had a hysterectomy.  Bowel movements normal  Modifying factors: There are no other modifying factors  Associated signs and symptoms: There are no other associated signs and symptoms Aggravating and relieving factors: There are no other aggravating or relieving factors Severity: Moderate Duration: Persistent   PMH: Past Medical History:  Diagnosis Date  . GERD (gastroesophageal reflux disease)   . Hypertension   . Irritable bowel syndrome     Surgical History: Past Surgical History:  Procedure Laterality Date  . ABDOMINAL HYSTERECTOMY    . CHOLECYSTECTOMY  01/06/2014  . COLONOSCOPY W/ BIOPSIES  May 30, 2011   random biopsies showed no evidence of colitis. Loistine Simas, M.D.  . COLONOSCOPY WITH PROPOFOL N/A 10/17/2016   Procedure: COLONOSCOPY WITH PROPOFOL;  Surgeon: Lollie Sails, MD;  Location: [facility] ENDOSCOPY;  Service: Endoscopy;  Laterality: N/A;  . ESOPHAGOGASTRODUODENOSCOPY (EGD) WITH PROPOFOL N/A 10/17/2016   Procedure: ESOPHAGOGASTRODUODENOSCOPY (EGD) WITH PROPOFOL;  Surgeon: Lollie Sails, MD;  Location: [facility] ENDOSCOPY;  Service: Endoscopy;  Laterality: N/A;  . PARTIAL  HYSTERECTOMY  1996  . UPPER GI ENDOSCOPY  May 30, 2011   Biopsy showed no malignancy  or active ulcer disease. Negative H. pylori. Loistine Simas, M.D.    Home Medications:  Allergies as of 11/14/2019   No Known Allergies     Medication List       Accurate as of November 14, 2019  2:02 PM. If you have any questions, ask your nurse or doctor.        STOP taking these medications   Dexilant 60 MG capsule Generic drug: dexlansoprazole Stopped by: Reece Packer, MD     TAKE these medications   atenolol 25 MG tablet Commonly known as: TENORMIN Take 25 mg by mouth daily.   B-COMPLEX PO Take 1 tablet by mouth daily.   esomeprazole 40 MG capsule Commonly known as: NEXIUM Take 40 mg by mouth daily at 12 noon.   hydrochlorothiazide 12.5 MG tablet Commonly known as: HYDRODIURIL Take 12.5 mg by mouth daily.   loratadine-pseudoephedrine 5-120 MG tablet Commonly known as: CLARITIN-D 12-hour Take 1 tablet by mouth daily.   multivitamin with minerals Tabs tablet Take 1 tablet by mouth daily.   SACCHAROMYCES BOULARDII PO Take by mouth daily.       Allergies: No Known Allergies  Family History: Family History  Problem Relation Age of Onset  . Hypertension Father   . Diabetes Mother   . Breast cancer Neg Hx     Social History:  reports that she quit smoking about 31 years ago. She quit after 2.00 years of use. She has  never used smokeless tobacco. She reports that she does not drink alcohol or use drugs.  ROS:                                        Physical Exam: BP 138/82   Pulse 86   Ht 4\' 11"  (1.499 m)   Wt 217 lb (98.4 kg)   BMI 43.83 kg/m   Constitutional:  Alert and oriented, No acute distress. HEENT: [place] AT, moist mucus membranes.  Trachea midline, no masses. Cardiovascular: No clubbing, cyanosis, or edema. Respiratory: Normal respiratory effort, no increased work of breathing. GI: Abdomen is soft, nontender,  nondistended, no abdominal masses GU: Grade 1 hypermobility the bladder neck and  no stress incontinence with mild cough Skin: No rashes, bruises or suspicious lesions. Lymph: No cervical or inguinal adenopathy. Neurologic: Grossly intact, no focal deficits, moving all 4 extremities. Psychiatric: Normal mood and affect.  Laboratory Data: Lab Results  Component Value Date   WBC 11.8 (H) 10/06/2013   HGB 13.3 10/06/2013   HCT 40.1 10/06/2013   MCV 83.2 10/06/2013   PLT 330 10/06/2013    Lab Results  Component Value Date   CREATININE 0.55 10/06/2013    No results found for: PSA  No results found for: TESTOSTERONE  No results found for: HGBA1C  Urinalysis No results found for: COLORURINE, APPEARANCEUR, LABSPEC, PHURINE, GLUCOSEU, HGBUR, BILIRUBINUR, KETONESUR, PROTEINUR, UROBILINOGEN, NITRITE, LEUKOCYTESUR  Pertinent Imaging:   Assessment & Plan: She has mixed symptoms but primarily significant frequency.  I mention urodynamics.  I gave her oxybutynin ER 10 mg and Detrol LA 4 mg.  By history she failed Myrbetriq and Vesicare.  We likely will perform cystoscopy next visit and she was made aware of this.  No blood in urine today but urine sent for culture.  She held off on physical therapy consultation by discussed behavioral modification with her today.  She might be open to physical therapy in the future  1. Overactive bladder  - Bladder Scan (Post Void Residual) in office - Urinalysis, Complete   No follow-ups on file.  13/05/2013, MD  [facility] [address] [place], [place] [phone]

## 2019-11-14 NOTE — Patient Instructions (Signed)
Cystoscopy Cystoscopy is a procedure that is used to help diagnose and sometimes treat conditions that affect the lower urinary tract. The lower urinary tract includes the bladder and the urethra. The urethra is the tube that drains urine from the bladder. Cystoscopy is done using a thin, tube-shaped instrument with a light and camera at the end (cystoscope). The cystoscope may be hard or flexible, depending on the goal of the procedure. The cystoscope is inserted through the urethra, into the bladder. Cystoscopy may be recommended if you have:  Urinary tract infections that keep coming back.  Blood in the urine (hematuria).  An inability to control when you urinate (urinary incontinence) or an overactive bladder.  Unusual cells found in a urine sample.  A blockage in the urethra, such as a urinary stone.  Painful urination.  An abnormality in the bladder found during an intravenous pyelogram (IVP) or CT scan. Cystoscopy may also be done to remove a sample of tissue to be examined under a microscope (biopsy). Tell a health care provider about:  Any allergies you have.  All medicines you are taking, including vitamins, herbs, eye drops, creams, and over-the-counter medicines.  Any problems you or family members have had with anesthetic medicines.  Any blood disorders you have.  Any surgeries you have had.  Any medical conditions you have.  Whether you are pregnant or may be pregnant. What are the risks? Generally, this is a safe procedure. However, problems may occur, including:  Infection.  Bleeding.  Allergic reactions to medicines.  Damage to other structures or organs. What happens before the procedure?  Ask your health care provider about: ? Changing or stopping your regular medicines. This is especially important if you are taking diabetes medicines or blood thinners. ? Taking medicines such as aspirin and ibuprofen. These medicines can thin your blood. Do not take  these medicines unless your health care provider tells you to take them. ? Taking over-the-counter medicines, vitamins, herbs, and supplements.  Follow instructions from your health care provider about eating or drinking restrictions.  Ask your health care provider what steps will be taken to help prevent infection. These may include: ? Washing skin with a germ-killing soap. ? Taking antibiotic medicine.  You may have an exam or testing, such as: ? X-rays of the bladder, urethra, or kidneys. ? Urine tests to check for signs of infection.  Plan to have someone take you home from the hospital or clinic. What happens during the procedure?   You will be given one or more of the following: ? A medicine to help you relax (sedative). ? A medicine to numb the area (local anesthetic).  The area around the opening of your urethra will be cleaned.  The cystoscope will be passed through your urethra into your bladder.  Germ-free (sterile) fluid will flow through the cystoscope to fill your bladder. The fluid will stretch your bladder so that your health care provider can clearly examine your bladder walls.  Your doctor will look at the urethra and bladder. Your doctor may take a biopsy or remove stones.  The cystoscope will be removed, and your bladder will be emptied. The procedure may vary among health care providers and hospitals. What can I expect after the procedure? After the procedure, it is common to have:  Some soreness or pain in your abdomen and urethra.  Urinary symptoms. These include: ? Mild pain or burning when you urinate. Pain should stop within a few minutes after you urinate. This   may last for up to 1 week. ? A small amount of blood in your urine for several days. ? Feeling like you need to urinate but producing only a small amount of urine. Follow these instructions at home: Medicines  Take over-the-counter and prescription medicines only as told by your health care  provider.  If you were prescribed an antibiotic medicine, take it as told by your health care provider. Do not stop taking the antibiotic even if you start to feel better. General instructions  Return to your normal activities as told by your health care provider. Ask your health care provider what activities are safe for you.  Do not drive for 24 hours if you were given a sedative during your procedure.  Watch for any blood in your urine. If the amount of blood in your urine increases, call your health care provider.  Follow instructions from your health care provider about eating or drinking restrictions.  If a tissue sample was removed for testing (biopsy) during your procedure, it is up to you to get your test results. Ask your health care provider, or the department that is doing the test, when your results will be ready.  Drink enough fluid to keep your urine pale yellow.  Keep all follow-up visits as told by your health care provider. This is important. Contact a health care provider if you:  Have pain that gets worse or does not get better with medicine, especially pain when you urinate.  Have trouble urinating.  Have more blood in your urine. Get help right away if you:  Have blood clots in your urine.  Have abdominal pain.  Have a fever or chills.  Are unable to urinate. Summary  Cystoscopy is a procedure that is used to help diagnose and sometimes treat conditions that affect the lower urinary tract.  Cystoscopy is done using a thin, tube-shaped instrument with a light and camera at the end.  After the procedure, it is common to have some soreness or pain in your abdomen and urethra.  Watch for any blood in your urine. If the amount of blood in your urine increases, call your health care provider.  If you were prescribed an antibiotic medicine, take it as told by your health care provider. Do not stop taking the antibiotic even if you start to feel better. This  information is not intended to replace advice given to you by your health care provider. Make sure you discuss any questions you have with your health care provider. Document Released: 11/14/2000 Document Revised: 11/09/2018 Document Reviewed: 11/09/2018 Elsevier Patient Education  2020 [employer]  

## 2019-11-15 LAB — MICROSCOPIC EXAMINATION: RBC: NONE SEEN /hpf (ref 0–2)

## 2019-11-15 LAB — URINALYSIS, COMPLETE
Bilirubin, UA: NEGATIVE
Glucose, UA: NEGATIVE
Ketones, UA: NEGATIVE
Leukocytes,UA: NEGATIVE
Nitrite, UA: NEGATIVE
Protein,UA: NEGATIVE
RBC, UA: NEGATIVE
Specific Gravity, UA: 1.01 (ref 1.005–1.030)
Urobilinogen, Ur: 0.2 mg/dL (ref 0.2–1.0)
pH, UA: 6.5 (ref 5.0–7.5)

## 2019-11-16 LAB — CULTURE, URINE COMPREHENSIVE

## 2020-01-09 ENCOUNTER — Encounter: Payer: Self-pay | Admitting: Urology

## 2020-01-09 ENCOUNTER — Other Ambulatory Visit: Payer: Self-pay

## 2020-01-09 ENCOUNTER — Ambulatory Visit: Payer: 59 | Admitting: Urology

## 2020-01-09 VITALS — BP 146/92 | HR 84 | Ht 59.0 in | Wt 217.0 lb

## 2020-01-09 DIAGNOSIS — R350 Frequency of micturition: Secondary | ICD-10-CM | POA: Diagnosis not present

## 2020-01-09 DIAGNOSIS — N3281 Overactive bladder: Secondary | ICD-10-CM

## 2020-01-09 LAB — URINALYSIS, COMPLETE
Bilirubin, UA: NEGATIVE
Glucose, UA: NEGATIVE
Ketones, UA: NEGATIVE
Leukocytes,UA: NEGATIVE
Nitrite, UA: NEGATIVE
Protein,UA: NEGATIVE
Specific Gravity, UA: 1.02 (ref 1.005–1.030)
Urobilinogen, Ur: 0.2 mg/dL (ref 0.2–1.0)
pH, UA: 7 (ref 5.0–7.5)

## 2020-01-09 LAB — MICROSCOPIC EXAMINATION: RBC: NONE SEEN /hpf (ref 0–2)

## 2020-01-09 NOTE — Progress Notes (Signed)
01/09/2020 9:19 AM   Gloria Baker 1971/01/11 [id number]  Referring provider: Maryland Pink, MD [address] [place],  [place] [address]  Chief Complaint  Patient presents with  . Cysto    HPI: Was consulted to assist the patient is worsening frequency and urgency.  After fluid she can void every 15 minutes.  Depending on fluid she may be allowed for 2 hours.  She has rare incontinence with urgency.  She does better sitting.  If she had a bad cold she can leak with coughing and sneezing wearing a pad but otherwise no pads  She failed 2 medications including Myrbetriq  She reports a good flow and has no nocturia.  She feels empty.  No pelvic pain    Grade 1 hypermobility the bladder neck and  no stress incontinence with mild cough Skin: No rashes, bruises or suspicious lesions.  She has mixed symptoms but primarily significant frequency.  I mention urodynamics.  I gave her oxybutynin ER 10 mg and Detrol LA 4 mg.  By history she failed Myrbetriq and Vesicare.  We likely will perform cystoscopy next visit and she was made aware of this.  No blood in urine today but urine sent for culture.  She held off on physical therapy consultation by discussed behavioral modification with her today.  She might be open to physical therapy in the future  Today Frequency stable.  Last culture negative And did not help her urgency.  Tolterodine helped urgency and frequency by a reasonable percentage Cystoscopy: Patient underwent flexible cystoscopy utilizing sterile technique.  Bladder mucosa and trigone were normal.  No foreign body or carcinoma.  No pelvic pain with filling   PMH: Past Medical History:  Diagnosis Date  . GERD (gastroesophageal reflux disease)   . Hypertension   . Irritable bowel syndrome     Surgical History: Past Surgical History:  Procedure Laterality Date  . ABDOMINAL HYSTERECTOMY    . CHOLECYSTECTOMY  01/06/2014  . COLONOSCOPY W/ BIOPSIES   May 30, 2011   random biopsies showed no evidence of colitis. Loistine Simas, M.D.  . COLONOSCOPY WITH PROPOFOL N/A 10/17/2016   Procedure: COLONOSCOPY WITH PROPOFOL;  Surgeon: Lollie Sails, MD;  Location: [facility] ENDOSCOPY;  Service: Endoscopy;  Laterality: N/A;  . ESOPHAGOGASTRODUODENOSCOPY (EGD) WITH PROPOFOL N/A 10/17/2016   Procedure: ESOPHAGOGASTRODUODENOSCOPY (EGD) WITH PROPOFOL;  Surgeon: Lollie Sails, MD;  Location: [facility] ENDOSCOPY;  Service: Endoscopy;  Laterality: N/A;  . PARTIAL HYSTERECTOMY  1996  . UPPER GI ENDOSCOPY  May 30, 2011   Biopsy showed no malignancy  or active ulcer disease. Negative H. pylori. Loistine Simas, M.D.    Home Medications:  Allergies as of 01/09/2020   No Known Allergies     Medication List       Accurate as of January 09, 2020  9:19 AM. If you have any questions, ask your nurse or doctor.        atenolol 25 MG tablet Commonly known as: TENORMIN Take 25 mg by mouth daily.   B-COMPLEX PO Take 1 tablet by mouth daily.   esomeprazole 40 MG capsule Commonly known as: NEXIUM Take 40 mg by mouth daily at 12 noon.   hydrochlorothiazide 12.5 MG tablet Commonly known as: HYDRODIURIL Take 12.5 mg by mouth daily.   loratadine-pseudoephedrine 5-120 MG tablet Commonly known as: CLARITIN-D 12-hour Take 1 tablet by mouth daily.   multivitamin with minerals Tabs tablet Take 1 tablet by mouth daily.   oxybutynin  10 MG 24 hr tablet Commonly known as: DITROPAN-XL Take 1 tablet (10 mg total) by mouth daily.   SACCHAROMYCES BOULARDII PO Take by mouth daily.   tolterodine 4 MG 24 hr capsule Commonly known as: Detrol LA Take 1 capsule (4 mg total) by mouth daily.       Allergies: No Known Allergies  Family History: Family History  Problem Relation Age of Onset  . Hypertension Father   . Diabetes Mother   . Breast cancer Neg Hx     Social History:  reports that she quit smoking about 32 years ago. She quit after 2.00 years of  use. She has never used smokeless tobacco. She reports that she does not drink alcohol or use drugs.  ROS:                                        Physical Exam: BP (!) 146/92   Pulse 84   Ht 4\' 11"  (1.499 m)   Wt 217 lb (98.4 kg)   BMI 43.83 kg/m   Constitutional:  Alert and oriented, No acute distress.  Laboratory Data: Lab Results  Component Value Date   WBC 11.8 (H) 10/06/2013   HGB 13.3 10/06/2013   HCT 40.1 10/06/2013   MCV 83.2 10/06/2013   PLT 330 10/06/2013    Lab Results  Component Value Date   CREATININE 0.55 10/06/2013    No results found for: PSA  No results found for: TESTOSTERONE  No results found for: HGBA1C  Urinalysis    Component Value Date/Time   APPEARANCEUR Clear 11/14/2019 1406   GLUCOSEU Negative 11/14/2019 1406   BILIRUBINUR Negative 11/14/2019 1406   PROTEINUR Negative 11/14/2019 1406   NITRITE Negative 11/14/2019 1406   LEUKOCYTESUR Negative 11/14/2019 1406    Pertinent Imaging:   Assessment & Plan: I recommended behavioral therapy with physical therapy and tolterodine.  I would order urodynamics if she was going to pursue something like InterStim or Botox  The diary and does drink sodas and coffee and this was discussed.  Physical therapy consultation given.  Go back on tolterodine.  Reevaluate in 6 months  1. Overactive bladder  - Urinalysis, Complete   No follow-ups on file.  11/16/2019, MD  [facility] [address] [place], [place] [phone]

## 2020-01-23 ENCOUNTER — Other Ambulatory Visit: Payer: Self-pay

## 2020-01-23 ENCOUNTER — Ambulatory Visit: Payer: 59 | Attending: Urology

## 2020-01-23 DIAGNOSIS — R293 Abnormal posture: Secondary | ICD-10-CM | POA: Insufficient documentation

## 2020-01-23 DIAGNOSIS — M533 Sacrococcygeal disorders, not elsewhere classified: Secondary | ICD-10-CM | POA: Diagnosis present

## 2020-01-23 DIAGNOSIS — M62838 Other muscle spasm: Secondary | ICD-10-CM | POA: Diagnosis present

## 2020-01-23 NOTE — Therapy (Signed)
[facility] MAIN [facility] SERVICES [address] [place], [place], [address] Phone: [phone]   Fax:  [phone]  Physical Therapy Evaluation  The patient has been informed of current processes in place at Outpatient Rehab to protect patients from Covid-19 exposure including social distancing, schedule modifications, and new cleaning procedures. After discussing their particular risk with a therapist based on the patient's personal risk factors, the patient has decided to proceed with in-person therapy.   Patient Details  Name: Gloria Baker MRN: [id number] Date of Birth: [birth year] No data recorded  Encounter Date: 01/23/2020  PT End of Session - 01/23/20 1259    Visit Number  1    Number of Visits  10    Date for PT Re-Evaluation  04/02/20    Authorization Type  [employer]    Authorization - Visit Number  1    Authorization - Number of Visits  10    PT Start Time  0730    PT Stop Time  0830    PT Time Calculation (min)  60 min    Activity Tolerance  Patient tolerated treatment well;No increased pain    Behavior During Therapy  WFL for tasks assessed/performed       Past Medical History:  Diagnosis Date  . GERD (gastroesophageal reflux disease)   . Hypertension   . Irritable bowel syndrome     Past Surgical History:  Procedure Laterality Date  . ABDOMINAL HYSTERECTOMY    . CHOLECYSTECTOMY  01/06/2014  . COLONOSCOPY W/ BIOPSIES  May 30, 2011   random biopsies showed no evidence of colitis. [name], M.D.  . COLONOSCOPY WITH PROPOFOL N/A 10/17/2016   Procedure: COLONOSCOPY WITH PROPOFOL;  Surgeon: Christena Deem, MD;  Location: [facility] ENDOSCOPY;  Service: Endoscopy;  Laterality: N/A;  . ESOPHAGOGASTRODUODENOSCOPY (EGD) WITH PROPOFOL N/A 10/17/2016   Procedure: ESOPHAGOGASTRODUODENOSCOPY (EGD) WITH PROPOFOL;  Surgeon: Christena Deem, MD;  Location: [facility] ENDOSCOPY;  Service: Endoscopy;  Laterality: N/A;  . PARTIAL HYSTERECTOMY   1996  . UPPER GI ENDOSCOPY  May 30, 2011   Biopsy showed no malignancy  or active ulcer disease. Negative H. pylori. [name], M.D.    There were no vitals filed for this visit.          Pelvic Floor Physical Therapy Evaluation and Assessment  SCREENING  Falls in last 6 mo: no   Red Flags:  Have you had any night sweats? no Unexplained weight loss? no Saddle anesthesia? no Unexplained changes in bowel or bladder habits? no  SUBJECTIVE  Patient reports: Gradual onset over ~ 2 years. Last October she went to [facility] and had to stop at every bathroom along the way. There was one ride that she was not able to go on because the line was long. She has started restricting fluids to manage. She has more urgency after drinking fluids. Is on weight-watchers. It is a big problem when she has in-person meetings that last 1 hr. Since taking the medicine it is a little better. She drinks her 2 cups of coffee over 2 hrs. In the morning (~ 4 hrs.). She used to try to walk 30 min. And take lunch for 30 min. She is not able to make the 30 min. Walking due to urgency. Last November she would have leakage every time she coughed when they went to [place].   She has a caffiene free- diet soda, has hot tea or water most of the day. "red-rose" or Chia tea.  It is not too bad at night (0-1 time) but she cuts of fluid early. Has a BM in the morning and will urinate before and after. (up to 5x in the morning). The biggest issue is when travelling.  Takes a fiber pill each morning for the IBS.  Has increased back/hip pain when she sits with her hip ER'd and when she stands up after sitting for a while.  Has gone to the chiropractor and massage and always tells them to avoid the tailbone/LB area because she "cant stand" to ave it pressed on.  4\' 11" , B foot pain.  Precautions:  none  Social/Family/Vocational History:   . 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    Recent Procedures/Tests/Findings:  Negative cystoscopy  Obstetrical History: 3 vaginal deliveries, episiotomy with one.  Gynecological History: Vaginal hysterectomy  Urinary History: ~ every 30 min-1 hr (15 min. In the morning). Has had SUI for ~ 2 years with heavy cough. Normally " a little gush". Does better when she sits, once she stands up "the race is on". Has to race to get the pants down in the bathroom.    Gastrointestinal History: IBS, having daily BM's with no feeling of incomplete emptying.  Sexual activity/pain: none  Location of pain: R LBP (R hip) Current pain:  1/10  Max pain:  4/10 Least pain:  0/10 Nature of pain: achy  Patient Goals: Not have to run to the bathroom all the time, be able to sit through an hour meeting comfortably. Be able to go on car-trips without fear.   OBJECTIVE  Posture/Observations:  Sitting:  Standing: R LE ER'd, weight on heels, anterior pelvic tilt, R PSIS high, R rot= pain in L shoulder. ~ 50% ROM B ~5 in. From floor with forward bend. Stretch in thighs.  Prone: more prominent neck extensors on the L>R, TTP B.  Palpation/Segmental Motion/Joint Play: R>L and base>apex for TTP and  mobility at sacrum. TTP to B Glute Max, Med, Min., Piriformis,OI, lumbar extensors. Cervical extensors  Special tests:   Leg-length: L 66.5cm, R 67.5cm  Range of Motion/Flexibilty:  Spine: L SB: 4 fingers, R SB, 3 fingers, ~50% limited rotation B, ~ 4 in. From floor with forward bend, tension felt in thighs Hips:   Strength/MMT: deferred to follow up LE MMT  LE MMT Left Right  Hip flex:  (L2) /5 /5  Hip ext: /5 /5  Hip abd: /5 /5  Hip add: /5 /5  Hip IR /5 /5  Hip ER /5 /5     Abdominal:  Palpation: TTP to R>L hip-flexors and lateral abdomen. Diastasis: none  Pelvic Floor External Exam: Deferred to follow-up Introitus Appears:  Skin integrity:  Palpation: Cough: Prolapse visible?: Scar mobility:  Internal Vaginal  Exam: Strength (PERF):  Symmetry: Palpation: Prolapse:   Internal Rectal Exam: Strength (PERF): Symmetry: Palpation: Prolapse:   Gait Analysis:   Pelvic Floor Outcome Measures: FOTO PFDI Urinary: 25, Urinary problem: 57  INTERVENTIONS THIS SESSION: Self-care: Educated on the structure and function of the pelvic floor in relation to their symptoms as well as the POC, and initial HEP in order to set patient expectations and understanding from which we will build on in the future sessions.  Theract: Educated on sitting posture and ergonomics for her work station at home to decrease pressure on the LB and decrease LBP and PFM dysfunction.  Total time: 60 min.           Objective measurements completed  on examination: See above findings.                PT Short Term Goals - 01/24/20 5188      PT SHORT TERM GOAL #1   Title  Patient will demonstrate a coordinated contraction, relaxation, and bulge of the pelvic floor muscles to demonstrate functional recruitment and motion and allow for further strengthening.    Baseline  Pt. history suggests poor PFM relaxation and contraction coordination    Time  8    Period  Weeks    Status  New    Target Date  03/20/20      PT SHORT TERM GOAL #2   Title  Patient will demonstrate improved pelvic alignment and balance of musculature surrounding the pelvis to facilitate decreased PFM spasms and decrease pelvic pain.    Baseline  anterior pelvic tilt, RLE long by ~ 1 cm. spasms surrounding R>L, posterior>anterior pelvis    Time  5    Period  Weeks    Status  New    Target Date  02/28/20      PT SHORT TERM GOAL #3   Title  Patient will demonstrate improved sitting and standing posture to demonstrate learning and decrease stress on the pelvic floor with functional activity.    Baseline  "prop-sitting" due to 4'11" and anterior pelvic tilt.    Time  5    Period  Weeks    Status  New    Target Date  02/28/20      PT  SHORT TERM GOAL #4   Title  Pt. will demonstrate effective use of urge-suppression tecnique at least 80% of the time to demonstrate improved PFM coordination and recruitment and allow for further reduction of UI.    Baseline  Pt. urinating as frequently as every 15 min. in the am. and up to 13+ times per day.    Time  5    Period  Weeks    Status  New    Target Date  02/28/20        PT Long Term Goals - 01/24/20 0834      PT LONG TERM GOAL #1   Title  Patient will report no episodes of SUI or UUI over the course of the prior two weeks to demonstrate improved functional ability.    Baseline  having UUI 13+ times/day and SUI with heavy cough/sneeze fit.    Time  10    Period  Weeks    Status  New    Target Date  04/03/20      PT LONG TERM GOAL #2   Title  Patient will describe pain no greater than 1/10 during walking for at least 30 min. to demonstrate improved functional ability and allow for increased activity for weight loss and overall health..    Baseline  Pain increases to 4/10 with activity.    Time  10    Period  Weeks    Status  New    Target Date  04/03/20      PT LONG TERM GOAL #3   Title  Patient will report urinating 6-8 times per day over the course of the prior week to demonstrate decreased frequency.    Baseline  Pt. is urinating 13+ times per day, unable to sit through a one-hour meeting without going to the restroom.    Time  10    Period  Days    Status  New    Target Date  04/03/20      PT LONG TERM GOAL #4   Title  Pt. will improve in FOTO score by 10 points to demonstrate improved function.    Baseline  FOTO PFDI Urinary: 25, Urinary problem: 57    Time  10    Period  Weeks    Status  New    Target Date  04/03/20             Plan - 01/24/20 0747    Clinical Impression Statement  Pt. is a 49 y/o female who presents with cheif c/o urinary freqency and mixed incontinence as well as LBP. Her PMH is significant for obesity, GERD, hypertension, and  IBS as well as h/o abdominal hysterectomy. Her clinical assessment revealed a LLD with the RLE long by ~ 1cm. anterior pelvic tilt, short stature, TTP through R>L and posterior>anterior hips, and poor sitting and standing posture/body mechanics. She will benefit from skilled pelvic health PT to address the noted deficits and to continue to ssess for and address any other potential causes of Sx.    Personal Factors and Comorbidities  Fitness;Profession;Comorbidity 3+    Comorbidities  IBS, Obesity, GERD, HTN    Examination-Activity Limitations  Continence;Bend;Locomotion Level;Stand;Lift;Sit;Transfers    Examination-Participation Restrictions  Community Activity;Laundry;Yard Work    Conservation officer, historic buildings  Unstable/Unpredictable    Scientist, water quality    Rehab Potential  Fair    PT Frequency  1x / week    PT Duration  Other (comment)   10 weeks   PT Treatment/Interventions  ADLs/Self Care Home Management;Biofeedback;Traction;Moist Heat;Electrical Stimulation;Functional mobility training;Neuromuscular re-education;Therapeutic exercise;Therapeutic activities;Patient/family education;Manual techniques;Dry needling;Passive range of motion;Scar mobilization;Taping;Spinal Manipulations;Joint Manipulations    PT Next Visit Plan  TP release Vs. DN to posterior hip, sacral mobs, give heel-lift. Posterior pelvic tilts, educate on bladder irritants, fluid intake, urge suppression (if kegel present)    PT Home Exercise Plan  ergonomics, seated posture.    Consulted and Agree with Plan of Care  Patient       Patient will benefit from skilled therapeutic intervention in order to improve the following deficits and impairments:  Improper body mechanics, Pain, Decreased coordination, Decreased scar mobility, Increased muscle spasms, Postural dysfunction, Decreased endurance, Decreased range of motion, Impaired flexibility, Obesity  Visit Diagnosis: Sacrococcygeal disorders, not elsewhere  classified  Other muscle spasm  Abnormal posture     Problem List Patient Active Problem List   Diagnosis Date Noted  . Gall stones 12/10/2013   Cleophus Molt DPT, ATC Cleophus Molt 01/24/2020, 10:58 AM  [facility] MAIN [facility] SERVICES [address] [place], [place], [address] Phone: [phone]   Fax:  [phone]  Name: Gloria Baker MRN: [id number] Date of Birth: Dec 29, 1970

## 2020-01-23 NOTE — Patient Instructions (Signed)
  When seated, you want to maintain pelvic neutral with the shoulders gently down and back and ears in line with your shoulders. A lumbar roll such as the one below or a home-made towel-roll can be used for this purpose. Even Olympic athletes can only maintain proper seated posture for about 10 minutes without support!    Pictured: The Original [employer] Lumbar Roll  

## 2020-01-30 ENCOUNTER — Other Ambulatory Visit: Payer: Self-pay

## 2020-01-30 ENCOUNTER — Ambulatory Visit: Payer: 59 | Attending: Urology

## 2020-01-30 DIAGNOSIS — M62838 Other muscle spasm: Secondary | ICD-10-CM | POA: Diagnosis present

## 2020-01-30 DIAGNOSIS — M533 Sacrococcygeal disorders, not elsewhere classified: Secondary | ICD-10-CM | POA: Diagnosis present

## 2020-01-30 DIAGNOSIS — R293 Abnormal posture: Secondary | ICD-10-CM | POA: Diagnosis present

## 2020-01-30 NOTE — Patient Instructions (Signed)
As you start wearing your heel-lift only wear it for an hour the first day and increase by an hour each day so you can allow for the body to adapt to the change easily without much pain. If your pain increases by more than 1-2 points, back off slightly or slow down how quickly you increase your wear time. Once you reach a full day of wear, use it as much as possible forever, even in house-shoes or flip-flops if necessary to keep yourself from reverting to bad pelvic and spinal alignment and having symptoms return.    Adjust-a-lift heel lift Can be found at [employer]  Pelvic Tilt With Pelvic Floor (Hook-Lying)        Lie with hips and knees bent. Squeeze pelvic floor and flatten low back while breathing out so that pelvis tilts. Repeat _2x15__ times. Do _1-2__ times a day.    Do 2 sets of 15 tilts per day. Breathe in when you tilt forward (A) and out when you tuck under (B).    Hold for 30 seconds (5 deep breaths) and repeat 2-3 times on each side once a day

## 2020-01-30 NOTE — Therapy (Signed)
[facility] MAIN [facility] SERVICES [address] [place], [place], [address] Phone: [phone]   Fax:  [phone]  Physical Therapy Treatment  The patient has been informed of current processes in place at Outpatient Rehab to protect patients from Covid-19 exposure including social distancing, schedule modifications, and new cleaning procedures. After discussing their particular risk with a therapist based on the patient's personal risk factors, the patient has decided to proceed with in-person therapy.   Patient Details  Name: Gloria Baker MRN: [id number] Date of Birth: 1971/03/23 No data recorded  Encounter Date: 01/30/2020  PT End of Session - 01/30/20 1451    Visit Number  2    Number of Visits  10    Date for PT Re-Evaluation  04/02/20    Authorization Type  Aetna    Authorization - Visit Number  2    Authorization - Number of Visits  10    PT Start Time  0730    PT Stop Time  0830    PT Time Calculation (min)  60 min    Activity Tolerance  Patient tolerated treatment well;No increased pain    Behavior During Therapy  WFL for tasks assessed/performed       Past Medical History:  Diagnosis Date  . GERD (gastroesophageal reflux disease)   . Hypertension   . Irritable bowel syndrome     Past Surgical History:  Procedure Laterality Date  . ABDOMINAL HYSTERECTOMY    . CHOLECYSTECTOMY  01/06/2014  . COLONOSCOPY W/ BIOPSIES  May 30, 2011   random biopsies showed no evidence of colitis. [name], M.D.  . COLONOSCOPY WITH PROPOFOL N/A 10/17/2016   Procedure: COLONOSCOPY WITH PROPOFOL;  Surgeon: Christena Deem, MD;  Location: [facility] ENDOSCOPY;  Service: Endoscopy;  Laterality: N/A;  . ESOPHAGOGASTRODUODENOSCOPY (EGD) WITH PROPOFOL N/A 10/17/2016   Procedure: ESOPHAGOGASTRODUODENOSCOPY (EGD) WITH PROPOFOL;  Surgeon: Christena Deem, MD;  Location: [facility] ENDOSCOPY;  Service: Endoscopy;  Laterality: N/A;  . PARTIAL HYSTERECTOMY   1996  . UPPER GI ENDOSCOPY  May 30, 2011   Biopsy showed no malignancy  or active ulcer disease. Negative H. pylori. [name], M.D.    There were no vitals filed for this visit.    Pelvic Floor Physical Therapy Treatment Note  SCREENING  Changes in medications, allergies, or medical history?: none    SUBJECTIVE  Patient reports: No changes   Precautions:  none  Pain update:  Location of pain: R LBP (R hip) Current pain: 1/10  Max pain: 4/10 Least pain: 0/10 Nature of pain:achy  **no pain following treatment  Patient Goals: Not have to run to the bathroom all the time, be able to sit through an hour meeting comfortably. Be able to go on car-trips without fear.   OBJECTIVE  Changes in: Posture/Observations:  Anterior pelvic tilt, RLE long by ~ 1 cm.  -with heel-lift PSIS appear level  Range of Motion/Flexibilty:  Decreased sacral mobility  At all borders  Strength/MMT:  LE MMT:  Pt. Demonstrates decreased stability in L SLS >R  Pelvic floor:  Abdominal:  Pt. Has difficulty recruiting both TA and obliques but is able with MOD TC, VC.  Palpation: TTP to R Iliacus, Psoas, Glute Med, max, and Piriformis.  Gait Analysis:  INTERVENTIONS THIS SESSION: Manual: performed TP release to R Piriformis, Glute max and Med followed by sacral mobs at all borders to decrease spasm and pain and allow for improved balance of musculature for improved function and  decreased symptoms and to improve mobility of joint and surrounding connective tissue and decrease pressure on nerve roots for improved conductivity and function of down-stream tissues.    Therex: Educated on and practiced deep core recruitment via pelvic tilts in seated and hook-lying to improve strength of muscles opposing tight musculature to allow reciprocal inhibition to improve balance of musculature surrounding the pelvis and improve overall posture for optimal musculature length-tension  relationship and function and side-stretch To maintain and improve muscle length and allow for improved balance of musculature for long-term symptom relief.  Self-care: Educated on heel-lift wear/how to increase wear time without increasing pain.  Total time: 60 min.                           PT Short Term Goals - 01/24/20 2202      PT SHORT TERM GOAL #1   Title  Patient will demonstrate a coordinated contraction, relaxation, and bulge of the pelvic floor muscles to demonstrate functional recruitment and motion and allow for further strengthening.    Baseline  Pt. history suggests poor PFM relaxation and contraction coordination    Time  8    Period  Weeks    Status  New    Target Date  03/20/20      PT SHORT TERM GOAL #2   Title  Patient will demonstrate improved pelvic alignment and balance of musculature surrounding the pelvis to facilitate decreased PFM spasms and decrease pelvic pain.    Baseline  anterior pelvic tilt, RLE long by ~ 1 cm. spasms surrounding R>L, posterior>anterior pelvis    Time  5    Period  Weeks    Status  New    Target Date  02/28/20      PT SHORT TERM GOAL #3   Title  Patient will demonstrate improved sitting and standing posture to demonstrate learning and decrease stress on the pelvic floor with functional activity.    Baseline  "prop-sitting" due to 4'11" and anterior pelvic tilt.    Time  5    Period  Weeks    Status  New    Target Date  02/28/20      PT SHORT TERM GOAL #4   Title  Pt. will demonstrate effective use of urge-suppression tecnique at least 80% of the time to demonstrate improved PFM coordination and recruitment and allow for further reduction of UI.    Baseline  Pt. urinating as frequently as every 15 min. in the am. and up to 13+ times per day.    Time  5    Period  Weeks    Status  New    Target Date  02/28/20        PT Long Term Goals - 01/24/20 0834      PT LONG TERM GOAL #1   Title  Patient will  report no episodes of SUI or UUI over the course of the prior two weeks to demonstrate improved functional ability.    Baseline  having UUI 13+ times/day and SUI with heavy cough/sneeze fit.    Time  10    Period  Weeks    Status  New    Target Date  04/03/20      PT LONG TERM GOAL #2   Title  Patient will describe pain no greater than 1/10 during walking for at least 30 min. to demonstrate improved functional ability and allow for increased activity for weight loss  and overall health..    Baseline  Pain increases to 4/10 with activity.    Time  10    Period  Weeks    Status  New    Target Date  04/03/20      PT LONG TERM GOAL #3   Title  Patient will report urinating 6-8 times per day over the course of the prior week to demonstrate decreased frequency.    Baseline  Pt. is urinating 13+ times per day, unable to sit through a one-hour meeting without going to the restroom.    Time  10    Period  Days    Status  New    Target Date  04/03/20      PT LONG TERM GOAL #4   Title  Pt. will improve in FOTO score by 10 points to demonstrate improved function.    Baseline  FOTO PFDI Urinary: 25, Urinary problem: 57    Time  10    Period  Weeks    Status  New    Target Date  04/03/20            Plan - 01/30/20 1451    Clinical Impression Statement  Pt. Responded well to all interventions today, demonstrating improved lumbosacral mobility and decreased pain/spasm as well as understanding and correct performance of all education and exercises provided today. They will continue to benefit from skilled physical therapy to work toward remaining goals and maximize function as well as decrease likelihood of symptom increase or recurrence.    PT Next Visit Plan  educate on bladder irritants, fluid intake, Internl/external assessment, urge suppression (if kegel present)    PT Home Exercise Plan  ergonomics, seated posture, posterior pelvic tilts (seated and supine), side-stretch, heel-lift,.     Consulted and Agree with Plan of Care  Patient       Patient will benefit from skilled therapeutic intervention in order to improve the following deficits and impairments:     Visit Diagnosis: Sacrococcygeal disorders, not elsewhere classified  Other muscle spasm  Abnormal posture     Problem List Patient Active Problem List   Diagnosis Date Noted  . Gall stones 12/10/2013   Willa Rough DPT, ATC Willa Rough 01/30/2020, 3:02 PM  [place] MAIN [facility] SERVICES [address] [place], [place], [address] Phone: [phone]   Fax:  [phone]  Name: SOPHY MESLER MRN: [id number] Date of Birth: [birth year]

## 2020-02-06 ENCOUNTER — Other Ambulatory Visit: Payer: Self-pay

## 2020-02-06 ENCOUNTER — Ambulatory Visit: Payer: 59

## 2020-02-06 DIAGNOSIS — R293 Abnormal posture: Secondary | ICD-10-CM

## 2020-02-06 DIAGNOSIS — M533 Sacrococcygeal disorders, not elsewhere classified: Secondary | ICD-10-CM

## 2020-02-06 DIAGNOSIS — M62838 Other muscle spasm: Secondary | ICD-10-CM

## 2020-02-06 NOTE — Patient Instructions (Signed)
  Shoot for 64 oz. Of plain water throughout the day. Start with 24 oz. Of water while you drink your coffee in the morning.   Urge supression technique:  1) Take a deep breath to convince yourself that you are in control and calm the nervous system.  2) Do 5 "quick-flick" kegels (pelvic floor muscle contractions) and re-assess the urge. Repeat another set if urge is still present. 3) Once the urge has decreased, start walking calmly to the the bathroom. Stop and repeat steps 1 and 2 as many times as needed until you can successfully get to the toilet. 4) Only once seated, take a deep breath and allow the pelvic floor muscles to relax and allow for the urine to flow.    Do not be discouraged if you are not successful the first couple times, this is normal and it will take practice but remember that YOU are in control. Start by practicing this at home where you do not have to worry as much if there were to be an accident. Allowing yourself to get rushed or nervous puts the bladder back in control and will not allow the technique to work.  Wait one more day, track your normal bladder function noting the time of day and seconds of urination,  Start drinking extra water on Wednesday and then track "In's and Out's" on Thursday and Saturday.       Bring both knees up to your chest and then hold the one farthest from the edge of the table/bed and let the other relax toward the floor until stretch is felt through the front of the hip.   Hold-relax: Gently lift the knee of the down leg up ~ 1/2 and inch and hold for 5 seconds, then let it relax all the way for a second and repeat 4 more times to decrease resting tension in the muscle.   Stretch: Let the leg relax and feel a stretch across the front of the hip/thigh as you take belly breaths. Hold for __5__ deep belly breaths. Relax. Repeat __2-3__ times per side.    Do this __1-2__ times per day.     Tuck your hips under, then keep the tuck as  you lean forward so you feel a stretch through your low back. Hold for 5 deep breaths, repeat 2-3 times, 1-2 times per day.     Sit up with a tall spine and cross one leg over the other knee. Hinge from the hip and lean until you can feel a stretch through your bottom hold for 5 deep breaths and then switch sides. Repeat 2-3 times on each side.    Tuck the pelvis under slightly and hold for 5 deep breaths, repeat 2-3 times on each side, once per day.   Available in 32 oz, 1/2 gallon, or 1 gallon sizes, all colors...... To help you increase your water intake, try getting a reusable water bottle with markings to help you keep up with your hydration throughout the day. You should be drinking ~ 50% of your bodyweight in Oz. Of water each day or an average of ~ 64 Oz.  You can also add fruit or cucumbers to your water to make it more interesting and desirable. Cucumbers and honeydew are good choices because they are not acidic like citrus fruit and they add a refreshing flavor.

## 2020-02-06 NOTE — Therapy (Signed)
[place] MAIN [facility] SERVICES [address] [place], [place], [address] Phone: [phone]   Fax:  [phone]  Physical Therapy Treatment  The patient has been informed of current processes in place at Outpatient Rehab to protect patients from Covid-19 exposure including social distancing, schedule modifications, and new cleaning procedures. After discussing their particular risk with a therapist based on the patient's personal risk factors, the patient has decided to proceed with in-person therapy.   Patient Details  Name: Gloria Baker MRN: [id number] Date of Birth: [birth year] No data recorded  Encounter Date: 02/06/2020  PT End of Session - 02/06/20 1301    Visit Number  3    Number of Visits  10    Date for PT Re-Evaluation  04/02/20    Authorization Type  Aetna    Authorization - Visit Number  3    Authorization - Number of Visits  10    Progress Note Due on Visit  10    PT Start Time  0730    PT Stop Time  0830    PT Time Calculation (min)  60 min    Activity Tolerance  Patient tolerated treatment well;No increased pain    Behavior During Therapy  WFL for tasks assessed/performed       Past Medical History:  Diagnosis Date  . GERD (gastroesophageal reflux disease)   . Hypertension   . Irritable bowel syndrome     Past Surgical History:  Procedure Laterality Date  . ABDOMINAL HYSTERECTOMY    . CHOLECYSTECTOMY  01/06/2014  . COLONOSCOPY W/ BIOPSIES  May 30, 2011   random biopsies showed no evidence of colitis. Loistine Simas, M.D.  . COLONOSCOPY WITH PROPOFOL N/A 10/17/2016   Procedure: COLONOSCOPY WITH PROPOFOL;  Surgeon: Lollie Sails, MD;  Location: [facility] ENDOSCOPY;  Service: Endoscopy;  Laterality: N/A;  . ESOPHAGOGASTRODUODENOSCOPY (EGD) WITH PROPOFOL N/A 10/17/2016   Procedure: ESOPHAGOGASTRODUODENOSCOPY (EGD) WITH PROPOFOL;  Surgeon: Lollie Sails, MD;  Location: [facility] ENDOSCOPY;  Service: Endoscopy;   Laterality: N/A;  . PARTIAL HYSTERECTOMY  1996  . UPPER GI ENDOSCOPY  May 30, 2011   Biopsy showed no malignancy  or active ulcer disease. Negative H. pylori. Loistine Simas, M.D.    There were no vitals filed for this visit.   [place] MAIN [facility] SERVICES [place], [place], [address] Phone: [phone]   Fax:  [phone]    Pelvic Floor Physical Therapy Treatment Note  SCREENING  Changes in medications, allergies, or medical history?: none    SUBJECTIVE  Patient reports: She is doing the exercises and paying attention to her sitting, doing better at work with this, harder when out and about. Is wearing the heel lift up to 4 hours and did not have increased pain. Her feet feel a little better. Can tell now when she does not wear it that she could feel a little discomfort. Pain increased with bed mobility or if getting up to walk after having sat still for a while.  2 (12 oz mug) cups of coffee in the morning over 4 hours. At lunch she has a diet caffeine free soda which she drinks over more than an hour. In the afternoon she will have a hot cup of tea or a 16oz cup of water. She will have 16 oz. Of water with dinner. She will have an "Ice" sparkling drink/flavored water in the evening. Even when she was drinking water not soda at  lunch she had a problem over lunch.   Precautions:  none  Pain update:  Location of pain: R LBP (R hip) Current pain: 0/10  Max pain: 3/10 (not  Constant) Least pain: 0/10 Nature of pain:achy  **no pain following treatment  Patient Goals: Not have to run to the bathroom all the time, be able to sit through an hour meeting comfortably. Be able to go on car-trips without fear.   OBJECTIVE  Changes in: Posture/Observations:  Anterior pelvic tilt, RLE long by ~ 1 cm. -with heel-lift PSIS appear level (from previous session)  Range of Motion/Flexibilty:  Decreased lumbar flexion  and hip EXT ROM.  Strength/MMT:  LE MMT:  Pt. Demonstrates decreased stability in L SLS >R (from previous session)  Pelvic floor:  Abdominal:  Pt. Has difficulty recruiting both TA and obliques but is able with MOD TC, VC. (from previous session)  Palpation:  Gait Analysis:  INTERVENTIONS THIS SESSION: Therex: educated on and practiced seated hip-flexor stretch, low back stretch, and piriformis stretch To maintain and improve muscle length and allow for improved balance of musculature for long-term symptom relief.  Self-care: Educated on Bladder irritants, increasing water intake/ norms, and urge suppression technique to improve behaviors that are capable of affecting UUI.  Total time: 60 min.                                                  PT Short Term Goals - 01/24/20 7026      PT SHORT TERM GOAL #1   Title  Patient will demonstrate a coordinated contraction, relaxation, and bulge of the pelvic floor muscles to demonstrate functional recruitment and motion and allow for further strengthening.    Baseline  Pt. history suggests poor PFM relaxation and contraction coordination    Time  8    Period  Weeks    Status  New    Target Date  03/20/20      PT SHORT TERM GOAL #2   Title  Patient will demonstrate improved pelvic alignment and balance of musculature surrounding the pelvis to facilitate decreased PFM spasms and decrease pelvic pain.    Baseline  anterior pelvic tilt, RLE long by ~ 1 cm. spasms surrounding R>L, posterior>anterior pelvis    Time  5    Period  Weeks    Status  New    Target Date  02/28/20      PT SHORT TERM GOAL #3   Title  Patient will demonstrate improved sitting and standing posture to demonstrate learning and decrease stress on the pelvic floor with functional activity.    Baseline  "prop-sitting" due to 4'11" and anterior pelvic tilt.    Time  5    Period  Weeks    Status  New    Target Date   02/28/20      PT SHORT TERM GOAL #4   Title  Pt. will demonstrate effective use of urge-suppression tecnique at least 80% of the time to demonstrate improved PFM coordination and recruitment and allow for further reduction of UI.    Baseline  Pt. urinating as frequently as every 15 min. in the am. and up to 13+ times per day.    Time  5    Period  Weeks    Status  New    Target Date  02/28/20  PT Long Term Goals - 01/24/20 0834      PT LONG TERM GOAL #1   Title  Patient will report no episodes of SUI or UUI over the course of the prior two weeks to demonstrate improved functional ability.    Baseline  having UUI 13+ times/day and SUI with heavy cough/sneeze fit.    Time  10    Period  Weeks    Status  New    Target Date  04/03/20      PT LONG TERM GOAL #2   Title  Patient will describe pain no greater than 1/10 during walking for at least 30 min. to demonstrate improved functional ability and allow for increased activity for weight loss and overall health..    Baseline  Pain increases to 4/10 with activity.    Time  10    Period  Weeks    Status  New    Target Date  04/03/20      PT LONG TERM GOAL #3   Title  Patient will report urinating 6-8 times per day over the course of the prior week to demonstrate decreased frequency.    Baseline  Pt. is urinating 13+ times per day, unable to sit through a one-hour meeting without going to the restroom.    Time  10    Period  Days    Status  New    Target Date  04/03/20      PT LONG TERM GOAL #4   Title  Pt. will improve in FOTO score by 10 points to demonstrate improved function.    Baseline  FOTO PFDI Urinary: 25, Urinary problem: 57    Time  10    Period  Weeks    Status  New    Target Date  04/03/20            Plan - 02/06/20 1301    Clinical Impression Statement  Pt. Responded well to all interventions today, demonstrating improved understanding and correct performance of all education and exercises provided  today. She was able to teach-back the urge suppression technique perfectly and demonstrated understanding of the mechanism of action. They will continue to benefit from skilled physical therapy to work toward remaining goals and maximize function as well as decrease likelihood of symptom increase or recurrence.     PT Next Visit Plan  Internl/external assessment,  progress deep core, DN to low back?    PT Home Exercise Plan  ergonomics, seated posture, posterior pelvic tilts (seated and supine), side-stretch, heel-lift, bladder irritants, fluid intake, urge suppression, low back stretch, hip-flexor stretch, piriformis stretch    Consulted and Agree with Plan of Care  Patient       Patient will benefit from skilled therapeutic intervention in order to improve the following deficits and impairments:     Visit Diagnosis: Sacrococcygeal disorders, not elsewhere classified  Other muscle spasm  Abnormal posture     Problem List Patient Active Problem List   Diagnosis Date Noted  . Gall stones 12/10/2013   Cleophus Molt DPT, ATC Cleophus Molt 02/07/2020, 8:34 AM  [place] MAIN [facility] SERVICES [address] [place], [place], [address] Phone: [phone]   Fax:  [phone]  Name: Gloria Baker MRN: [id number] Date of Birth: [birth year]

## 2020-02-13 ENCOUNTER — Ambulatory Visit: Payer: 59

## 2020-02-13 ENCOUNTER — Other Ambulatory Visit: Payer: Self-pay

## 2020-02-13 DIAGNOSIS — M62838 Other muscle spasm: Secondary | ICD-10-CM

## 2020-02-13 DIAGNOSIS — M533 Sacrococcygeal disorders, not elsewhere classified: Secondary | ICD-10-CM

## 2020-02-13 DIAGNOSIS — R293 Abnormal posture: Secondary | ICD-10-CM

## 2020-02-13 NOTE — Patient Instructions (Signed)
Int[employer] Rose internal trigger point release tool [phone]  Dr. Alonna Minium Premium Prostate Massager   [phone]  Self Internal Trigger Point Relief    1) Wash your hands and prop yourself up in a way where you can easily reach the vagina. You may wish to have a small hand-held mirror near by.  2) lubricate the tool and vaginal opening using a hypoallergenic lubricant such as "slippery-stuff".   3) Slowly and gently insert the tool into the vagina using deep breaths to allow relaxation of the muscles around the tool.  4) Avoiding the "12 o-clock" region near the urethra, gently use the handle of the tool like a lever to press the angled tip of the tool onto the wall of the pelvic floor.   5) Move the tool to different areas of the pelvic floor and feel for areas that are tender called "trigger points". When you find one hold the tool still, applying just enough pressure to elicit mild discomfort and take deep belly breaths until the discomfort subsides or decreases by at least 50%.   6) Repeat the process for any trigger points you find spending between 3-10 minutes on this per night until you do not find any more trigger points or you are told otherwise by your therapist..

## 2020-02-13 NOTE — Therapy (Signed)
[facility] MAIN [facility] SERVICES [address] [place], [place], [address] Phone: [phone]   Fax:  [phone]  Physical Therapy Treatment  The patient has been informed of current processes in place at Outpatient Rehab to protect patients from Covid-19 exposure including social distancing, schedule modifications, and new cleaning procedures. After discussing their particular risk with a therapist based on the patient's personal risk factors, the patient has decided to proceed with in-person therapy.   Patient Details  Name: Gloria Baker MRN: [id number] Date of Birth: 03/17/1971 No data recorded  Encounter Date: 02/13/2020  PT End of Session - 02/13/20 0837    Visit Number  4    Number of Visits  10    Date for PT Re-Evaluation  04/02/20    Authorization Type  Aet[employer]    Authorization - Visit Number  4    Authorization - Number of Visits  10    Progress Note Due on Visit  10    PT Start Time  0736    PT Stop Time  0836    PT Time Calculation (min)  60 min    Activity Tolerance  Patient tolerated treatment well;No increased pain    Behavior During Therapy  WFL for tasks assessed/performed       Past Medical History:  Diagnosis Date  . GERD (gastroesophageal reflux disease)   . Hypertension   . Irritable bowel syndrome     Past Surgical History:  Procedure Laterality Date  . ABDOMINAL HYSTERECTOMY    . CHOLECYSTECTOMY  01/06/2014  . COLONOSCOPY W/ BIOPSIES  May 30, 2011   random biopsies showed no evidence of colitis. [name], M.D.  . COLONOSCOPY WITH PROPOFOL N/A 10/17/2016   Procedure: COLONOSCOPY WITH PROPOFOL;  Surgeon: Christena Deem, MD;  Location: [facility] ENDOSCOPY;  Service: Endoscopy;  Laterality: N/A;  . ESOPHAGOGASTRODUODENOSCOPY (EGD) WITH PROPOFOL N/A 10/17/2016   Procedure: ESOPHAGOGASTRODUODENOSCOPY (EGD) WITH PROPOFOL;  Surgeon: Christena Deem, MD;  Location: [facility] ENDOSCOPY;  Service: Endoscopy;   Laterality: N/A;  . PARTIAL HYSTERECTOMY  1996  . UPPER GI ENDOSCOPY  May 30, 2011   Biopsy showed no malignancy  or active ulcer disease. Negative H. pylori. [name], M.D.    There were no vitals filed for this visit.   Pelvic Floor Physical Therapy Treatment Note  SCREENING  Changes in medications, allergies, or medical history?: none    SUBJECTIVE  Patient reports: Has done her bladder diary. Found that if she does her urge suppression while sitting she has much better control. Has a harder time when she is standing but can still do it.  *frequency is in the morning ~ every 15-30 (around BM) min and less frequent throughout the day  Precautions:  none  Pain update:  Location of pain: R LBP (R hip) Current pain: 0/10  Max pain: 3/10 (not  Constant) Least pain: 0/10 Nature of pain:achy  **no pain following treatment  Patient Goals: Not have to run to the bathroom all the time, be able to sit through an hour meeting comfortably. Be able to go on car-trips without fear.   OBJECTIVE  Changes in: Posture/Observations:  Anterior pelvic tilt, RLE long by ~ 1 cm. -with heel-lift PSIS appear level (from previous session)  Range of Motion/Flexibilty:  Decreased lumbar flexion and hip EXT ROM.  Strength/MMT:  LE MMT:  Pt. Demonstrates decreased stability in L SLS >R (from previous session)  Pelvic Floor External Exam: Introitus Appears: WNL Skin  integrity: WNL Palpation: no TTP Cough: intact Prolapse visible?: yes with bearing down Scar mobility: needs further assessment  Internal Vaginal Exam: Strength (PERF): 4-/5 Symmetry: greatest TTP at L coccygeus Palpation: TTP throughout Prolapse: posterior wall visible ~ 1 cm above the level of the introitus  Abdominal:  Pt. Has difficulty recruiting both TA and obliques but is able with MOD TC, VC. (from previous session)  Palpation:  Gait Analysis:  INTERVENTIONS THIS SESSION: Manual:  Assessed PFM, performed TP release to B anterior PR/PC and to R posterior PR/PC to decrease spasm and pain and allow for improved balance of musculature for improved function and decreased symptoms.  Self-care: Educated on how prolapse and PFM spasms and how they can affect frequency doing posterior pelvic tilts with pillow under the hips to help "re-set" if she has downward pressure with BM creating increased frequency from prolapse.   Total time: 60 min.                            PT Short Term Goals - 01/24/20 4481      PT SHORT TERM GOAL #1   Title  Patient will demonstrate a coordinated contraction, relaxation, and bulge of the pelvic floor muscles to demonstrate functional recruitment and motion and allow for further strengthening.    Baseline  Pt. history suggests poor PFM relaxation and contraction coordination    Time  8    Period  Weeks    Status  New    Target Date  03/20/20      PT SHORT TERM GOAL #2   Title  Patient will demonstrate improved pelvic alignment and balance of musculature surrounding the pelvis to facilitate decreased PFM spasms and decrease pelvic pain.    Baseline  anterior pelvic tilt, RLE long by ~ 1 cm. spasms surrounding R>L, posterior>anterior pelvis    Time  5    Period  Weeks    Status  New    Target Date  02/28/20      PT SHORT TERM GOAL #3   Title  Patient will demonstrate improved sitting and standing posture to demonstrate learning and decrease stress on the pelvic floor with functional activity.    Baseline  "prop-sitting" due to 4'11" and anterior pelvic tilt.    Time  5    Period  Weeks    Status  New    Target Date  02/28/20      PT SHORT TERM GOAL #4   Title  Pt. will demonstrate effective use of urge-suppression tecnique at least 80% of the time to demonstrate improved PFM coordination and recruitment and allow for further reduction of UI.    Baseline  Pt. urinating as frequently as every 15 min. in the am. and  up to 13+ times per day.    Time  5    Period  Weeks    Status  New    Target Date  02/28/20        PT Long Term Goals - 01/24/20 0834      PT LONG TERM GOAL #1   Title  Patient will report no episodes of SUI or UUI over the course of the prior two weeks to demonstrate improved functional ability.    Baseline  having UUI 13+ times/day and SUI with heavy cough/sneeze fit.    Time  10    Period  Weeks    Status  New    Target Date  04/03/20      PT LONG TERM GOAL #2   Title  Patient will describe pain no greater than 1/10 during walking for at least 30 min. to demonstrate improved functional ability and allow for increased activity for weight loss and overall health..    Baseline  Pain increases to 4/10 with activity.    Time  10    Period  Weeks    Status  New    Target Date  04/03/20      PT LONG TERM GOAL #3   Title  Patient will report urinating 6-8 times per day over the course of the prior week to demonstrate decreased frequency.    Baseline  Pt. is urinating 13+ times per day, unable to sit through a one-hour meeting without going to the restroom.    Time  10    Period  Days    Status  New    Target Date  04/03/20      PT LONG TERM GOAL #4   Title  Pt. will improve in FOTO score by 10 points to demonstrate improved function.    Baseline  FOTO PFDI Urinary: 25, Urinary problem: 57    Time  10    Period  Weeks    Status  New    Target Date  04/03/20            Plan - 02/13/20 0837    Clinical Impression Statement  Pt. Responded well to all interventions today, demonstrating decreased PFM spasm as well as understanding and correct performance of all education and exercises provided today. They will continue to benefit from skilled physical therapy to work toward remaining goals and maximize function as well as decrease likelihood of symptom increase or recurrence.     PT Next Visit Plan  Internal self TP release training vs. TP release,  progress deep core, DN  to low back? give handout for tilts with pillow PRN    PT Home Exercise Plan  ergonomics, seated posture, posterior pelvic tilts (seated and supine), side-stretch, heel-lift, bladder irritants, fluid intake, urge suppression, low back stretch, hip-flexor stretch, piriformis stretch, posterior tilts with pillow, tools    Consulted and Agree with Plan of Care  Patient       Patient will benefit from skilled therapeutic intervention in order to improve the following deficits and impairments:     Visit Diagnosis: Sacrococcygeal disorders, not elsewhere classified  Other muscle spasm  Abnormal posture     Problem List Patient Active Problem List   Diagnosis Date Noted  . Gall stones 12/10/2013   Willa Rough DPT, ATC Willa Rough 02/13/2020, 12:14 PM  [place] MAIN [facility] SERVICES [address] [place], [place], [address] Phone: [phone]   Fax:  [phone]  Name: Gloria Baker MRN: [id number] Date of Birth: 1971-04-29

## 2020-02-20 ENCOUNTER — Ambulatory Visit: Payer: 59

## 2020-02-20 ENCOUNTER — Other Ambulatory Visit: Payer: Self-pay

## 2020-02-20 DIAGNOSIS — M533 Sacrococcygeal disorders, not elsewhere classified: Secondary | ICD-10-CM

## 2020-02-20 DIAGNOSIS — R293 Abnormal posture: Secondary | ICD-10-CM

## 2020-02-20 DIAGNOSIS — M62838 Other muscle spasm: Secondary | ICD-10-CM

## 2020-02-20 NOTE — Therapy (Signed)
[facility] MAIN [facility] SERVICES [address] [place], [place], [address] Phone: [phone]   Fax:  [phone]  Physical Therapy Treatment  The patient has been informed of current processes in place at Outpatient Rehab to protect patients from Covid-19 exposure including social distancing, schedule modifications, and new cleaning procedures. After discussing their particular risk with a therapist based on the patient's personal risk factors, the patient has decided to proceed with in-person therapy.   Patient Details  Name: Gloria Baker MRN: [id number] Date of Birth: 11-29-1971 No data recorded  Encounter Date: 02/20/2020  PT End of Session - 02/20/20 1257    Visit Number  5    Number of Visits  10    Date for PT Re-Evaluation  04/02/20    Authorization Type  Aetna    Authorization - Visit Number  5    Authorization - Number of Visits  10    Progress Note Due on Visit  10    PT Start Time  0735    PT Stop Time  0835    PT Time Calculation (min)  60 min    Activity Tolerance  Patient tolerated treatment well;No increased pain    Behavior During Therapy  WFL for tasks assessed/performed       Past Medical History:  Diagnosis Date  . GERD (gastroesophageal reflux disease)   . Hypertension   . Irritable bowel syndrome     Past Surgical History:  Procedure Laterality Date  . ABDOMINAL HYSTERECTOMY    . CHOLECYSTECTOMY  01/06/2014  . COLONOSCOPY W/ BIOPSIES  May 30, 2011   random biopsies showed no evidence of colitis. [name], M.D.  . COLONOSCOPY WITH PROPOFOL N/A 10/17/2016   Procedure: COLONOSCOPY WITH PROPOFOL;  Surgeon: Christena Deem, MD;  Location: [facility] ENDOSCOPY;  Service: Endoscopy;  Laterality: N/A;  . ESOPHAGOGASTRODUODENOSCOPY (EGD) WITH PROPOFOL N/A 10/17/2016   Procedure: ESOPHAGOGASTRODUODENOSCOPY (EGD) WITH PROPOFOL;  Surgeon: Christena Deem, MD;  Location: [facility] ENDOSCOPY;  Service: Endoscopy;   Laterality: N/A;  . PARTIAL HYSTERECTOMY  1996  . UPPER GI ENDOSCOPY  May 30, 2011   Biopsy showed no malignancy  or active ulcer disease. Negative H. pylori. [name], M.D.    There were no vitals filed for this visit.    Pelvic Floor Physical Therapy Treatment Note  SCREENING  Changes in medications, allergies, or medical history?: none    SUBJECTIVE  Patient reports: Got the tool and has been trying to do the self release. Has found more points on the left, not as much on the right. Feels more soreness toward thew back, feels it after the release. Feels like something was "stuck up her butt and it hurts". Has tried to do the exercises after a BM and has found that this helps a little. The water with the coffee. Does not seem to be helping, it is just extra peeing. On Saturday she had just the coffee and did have less urination. Had one day where it felt like there is a ball. She had to go really bad and she urinated for ~ 23 seconds and then 15 min. Later had to go back and the next time was 15-17 seconds. Has had a little bit of L LBP and found that with a certain pair of shoes that the heel lift does not fit down in well that could be the issue. (Pt. Gestures to L hip-flexor)  Precautions:  none  Pain update:  Location of  pain: R LBP (R hip) Current pain: 3/10  Max pain: 5/10 (started just yesterday) Least pain: 0/10 Nature of pain:achy  **Pt reports "less pain" in her hip following treatment.  Patient Goals: Not have to run to the bathroom all the time, be able to sit through an hour meeting comfortably. Be able to go on car-trips without fear.   OBJECTIVE  Changes in: Posture/Observations:  Anterior pelvic tilt, RLE long by ~ 1 cm. -with heel-lift PSIS appear level (from previous session)  L>R over-pronation.  Range of Motion/Flexibilty:   Strength/MMT:  LE MMT:  Pt. Demonstrates decreased stability in L SLS >R (from previous session)  Pelvic  Floor External Exam: Introitus Appears: WNL Skin integrity: WNL Palpation: no TTP Cough: intact Prolapse visible?: yes with bearing down Scar mobility: needs further assessment  Internal Vaginal Exam: Strength (PERF): 4-/5 Symmetry: greatest TTP at L coccygeus Palpation: TTP throughout Prolapse: posterior wall visible ~ 1 cm above the level of the introitus  Abdominal:  Pt. Able to demonstrate improved coordination of  TA and PFM with exhale after internal release.  Palpation:  Gait Analysis:  INTERVENTIONS THIS SESSION: Manual: Performed TP release to all PFM on L and reviewed how to perform self TP release with tool at home using pelvic model for visual aid while internal release performed to decrease spasm and pain and allow for improved balance of musculature for improved function and decreased symptoms.  Self-care: Educated on use of toe separator and self-release with firm ball such as tennis ball to allow for decreased fascial tension and allow for improved recruitment and decreased pain/spasm that can lead to PFM tightness.  Therex: Educated on and practiced calf stretch, toe scrunches and arch crunches to allow for improved arch support and decreased spasm for decreased pain and neural overflow to PFM.    Total time: 60 min.                           PT Short Term Goals - 01/24/20 6237      PT SHORT TERM GOAL #1   Title  Patient will demonstrate a coordinated contraction, relaxation, and bulge of the pelvic floor muscles to demonstrate functional recruitment and motion and allow for further strengthening.    Baseline  Pt. history suggests poor PFM relaxation and contraction coordination    Time  8    Period  Weeks    Status  New    Target Date  03/20/20      PT SHORT TERM GOAL #2   Title  Patient will demonstrate improved pelvic alignment and balance of musculature surrounding the pelvis to facilitate decreased PFM spasms and decrease pelvic  pain.    Baseline  anterior pelvic tilt, RLE long by ~ 1 cm. spasms surrounding R>L, posterior>anterior pelvis    Time  5    Period  Weeks    Status  New    Target Date  02/28/20      PT SHORT TERM GOAL #3   Title  Patient will demonstrate improved sitting and standing posture to demonstrate learning and decrease stress on the pelvic floor with functional activity.    Baseline  "prop-sitting" due to 4'11" and anterior pelvic tilt.    Time  5    Period  Weeks    Status  New    Target Date  02/28/20      PT SHORT TERM GOAL #4   Title  Pt. will  demonstrate effective use of urge-suppression tecnique at least 80% of the time to demonstrate improved PFM coordination and recruitment and allow for further reduction of UI.    Baseline  Pt. urinating as frequently as every 15 min. in the am. and up to 13+ times per day.    Time  5    Period  Weeks    Status  New    Target Date  02/28/20        PT Long Term Goals - 01/24/20 0834      PT LONG TERM GOAL #1   Title  Patient will report no episodes of SUI or UUI over the course of the prior two weeks to demonstrate improved functional ability.    Baseline  having UUI 13+ times/day and SUI with heavy cough/sneeze fit.    Time  10    Period  Weeks    Status  New    Target Date  04/03/20      PT LONG TERM GOAL #2   Title  Patient will describe pain no greater than 1/10 during walking for at least 30 min. to demonstrate improved functional ability and allow for increased activity for weight loss and overall health..    Baseline  Pain increases to 4/10 with activity.    Time  10    Period  Weeks    Status  New    Target Date  04/03/20      PT LONG TERM GOAL #3   Title  Patient will report urinating 6-8 times per day over the course of the prior week to demonstrate decreased frequency.    Baseline  Pt. is urinating 13+ times per day, unable to sit through a one-hour meeting without going to the restroom.    Time  10    Period  Days     Status  New    Target Date  04/03/20      PT LONG TERM GOAL #4   Title  Pt. will improve in FOTO score by 10 points to demonstrate improved function.    Baseline  FOTO PFDI Urinary: 25, Urinary problem: 57    Time  10    Period  Weeks    Status  New    Target Date  04/03/20            Plan - 02/20/20 1258    Clinical Impression Statement  Pt. Responded well to all interventions today, demonstrating decreased PFM spasms and pain, improved coordination of deep core, as well as understanding and correct performance of all education and exercises provided today. They will continue to benefit from skilled physical therapy to work toward remaining goals and maximize function as well as decrease likelihood of symptom increase or recurrence.     PT Next Visit Plan  further internal TP release?  progress deep core, DN to low back? give handout for tilts with pillow PRN    PT Home Exercise Plan  ergonomics, seated posture, posterior pelvic tilts (seated and supine), side-stretch, heel-lift, bladder irritants, fluid intake, urge suppression, low back stretch, hip-flexor stretch, piriformis stretch, posterior tilts with pillow, tools, arch crunches, toe scrunches, pedicure toe separators, foot release with ball, calf stretch,    Consulted and Agree with Plan of Care  Patient       Patient will benefit from skilled therapeutic intervention in order to improve the following deficits and impairments:     Visit Diagnosis: Sacrococcygeal disorders, not elsewhere classified  Other muscle spasm  Abnormal posture     Problem List Patient Active Problem List   Diagnosis Date Noted  . Gall stones 12/10/2013   Willa Rough DPT, ATC Willa Rough 02/20/2020, 1:00 PM  [place] MAIN [facility] SERVICES [address] [place], [place], [address] Phone: [phone]   Fax:  [phone]  Name: Gloria Baker MRN: [id number] Date of Birth:  Sep 11, 1971

## 2020-02-20 NOTE — Patient Instructions (Signed)
Toe "scrunches"  Do 2x10, 1 time per day  Arch "crunches"   Do 2x10, 1 time per day   Put in place for ~ 20 in. At night while resting.   Bring the toe up on the wall, then shift hips forward until you feel stretch, hold for 5 deep breaths, repeat with knee slightly bent. Do this 2 times on each leg.  Check email for video on foot release.

## 2020-02-27 ENCOUNTER — Ambulatory Visit: Payer: 59

## 2020-02-27 ENCOUNTER — Other Ambulatory Visit: Payer: Self-pay

## 2020-02-27 DIAGNOSIS — R293 Abnormal posture: Secondary | ICD-10-CM

## 2020-02-27 DIAGNOSIS — M533 Sacrococcygeal disorders, not elsewhere classified: Secondary | ICD-10-CM

## 2020-02-27 DIAGNOSIS — M62838 Other muscle spasm: Secondary | ICD-10-CM

## 2020-02-27 NOTE — Patient Instructions (Signed)
   Sit, feet flat, scoot forward to the edge of the chair. Inhale as you bend forward at hips, begin to exhale just before and while you stand, contracting the glutes, lower tummy muscles and pelvic floor as if stopping urination as you stand up.   * Do this every time you sit or stand! If you catch yourself doing it "wrong, re-set and do it again so it can become habit!    Getting In/out of bed     Lying on back, bend left knee and place left arm across chest. Roll all in one movement to the right. Reverse to roll to the left. Always move as one unit.     Once you are lying on you side, move legs to edge of bed. Pull in the pelvic floor and lower tummy and push down with both hands while moving legs off bed to reach sitting position.  *Reverse sequence to return to lying down.  Copyright  [employer]HI. All rights reserved.      EXhale on EXertion!!!!! (pushing, pulling, lifting, standing, etc.)  Whenever you exert force you need to exhale to allow the pressure to escape out of your vocal cords rather than be pressed down through your pelvic floor. Exhaling also helps engage your deep-core muscles to protect your back and pelvic floor.

## 2020-02-27 NOTE — Therapy (Signed)
[place] [facility] : [facility] MAIN [facility] SERVICES [address] [place], [place], [address] Phone: [phone]   Fax:  [phone]  Physical Therapy Treatment  The patient has been informed of current processes in place at Outpatient Rehab to protect patients from Covid-19 exposure including social distancing, schedule modifications, and new cleaning procedures. After discussing their particular risk with a therapist based on the patient's personal risk factors, the patient has decided to proceed with in-person therapy.   Patient Details  Name: DORTHA NEIGHBORS MRN: [id number] Date of Birth: [birth year] No data recorded  Encounter Date: 02/27/2020  PT End of Session - 02/28/20 0917    Visit Number  6    Number of Visits  10    Date for PT Re-Evaluation  04/02/20    Authorization Type  [employer]    Authorization - Visit Number  6    Authorization - Number of Visits  10    Progress Note Due on Visit  10    PT Start Time  0730    PT Stop Time  0830    PT Time Calculation (min)  60 min    Activity Tolerance  Patient tolerated treatment well;No increased pain    Behavior During Therapy  WFL for tasks assessed/performed       Past Medical History:  Diagnosis Date  . GERD (gastroesophageal reflux disease)   . Hypertension   . Irritable bowel syndrome     Past Surgical History:  Procedure Laterality Date  . ABDOMINAL HYSTERECTOMY    . CHOLECYSTECTOMY  01/06/2014  . COLONOSCOPY W/ BIOPSIES  May 30, 2011   random biopsies showed no evidence of colitis. [name], M.D.  . COLONOSCOPY WITH PROPOFOL N/A 10/17/2016   Procedure: COLONOSCOPY WITH PROPOFOL;  Surgeon: Christena Deem, MD;  Location: [facility] ENDOSCOPY;  Service: Endoscopy;  Laterality: N/A;  . ESOPHAGOGASTRODUODENOSCOPY (EGD) WITH PROPOFOL N/A 10/17/2016   Procedure: ESOPHAGOGASTRODUODENOSCOPY (EGD) WITH PROPOFOL;  Surgeon: Christena Deem, MD;  Location: [facility] ENDOSCOPY;  Service: Endoscopy;   Laterality: N/A;  . PARTIAL HYSTERECTOMY  1996  . UPPER GI ENDOSCOPY  May 30, 2011   Biopsy showed no malignancy  or active ulcer disease. Negative H. pylori. [name], M.D.    There were no vitals filed for this visit.  Pelvic Floor Physical Therapy Treatment Note  SCREENING  Changes in medications, allergies, or medical history?: none    SUBJECTIVE  Patient reports: Did a bunch of yard work this weekend, still sore. Feels like frequency got better starting Friday. Tracked her bladder Saturday and Sunday. Because the weekend is different she is not sure. She will track again today. Actually had some 2-2.5 hour periods in between in the morning on Sat. And Sun. Only found 2 spots on the left internally and worked on them twice, not really finding any on the right. Does think she can tell a little difference on the days when she remembers to do tilts on pillow after BM.  Precautions:  none  Pain update:  Location of pain: R LBP  Current pain: 0/10  Max pain: 6/10 (following yard work) Least pain: 0/10 Nature of pain:achy  **no pain following treatment.  Patient Goals: Not have to run to the bathroom all the time, be able to sit through an hour meeting comfortably. Be able to go on car-trips without fear.   OBJECTIVE  Changes in: Posture/Observations:  Anterior pelvic tilt, RLE long by ~ 1 cm. -with heel-lift PSIS appear level (from  previous session)   Range of Motion/Flexibilty:   Strength/MMT:  LE MMT:  Pt. Demonstrates decreased stability in L SLS >R (from previous session)  Pelvic Floor (from 3-15) [External Exam: Introitus Appears: WNL Skin integrity: WNL Palpation: no TTP Cough: intact Prolapse visible?: yes with bearing down Scar mobility: needs further assessment  Internal Vaginal Exam: Strength (PERF): 4-/5 Symmetry: greatest TTP at L coccygeus Palpation: TTP throughout Prolapse: posterior wall visible ~ 1 cm above the level of the  introitus]  Abdominal:  Pt. Able to demonstrate improved coordination of  TA followed by obliques with use of green band for tactile feedback  Palpation:  Gait Analysis:  INTERVENTIONS THIS SESSION: Manual: Performed TP release to B L4-L5 lumbar multifidus and erector spinae to decrease spasm and pain and allow for improved balance of musculature for improved function and decreased symptoms.  Dry-needle: Performed TPDN with a .30x162mm needle and standard approach as described below to decrease spasm and pain and allow for improved balance of musculature for improved function and decreased symptoms.   Self-care: Educated on and practiced sit-to-stand, supine-to-dit, and soda can theory to minimize downward pressure on the pelvic organs and decrease POP/frequency/urgency.  Therex: reviewed posterior pelvic tilts in hook-lying and focused on engaging obliques, used PNF with green band to fully engage. Educated on and practiced mini-marches but held off on adding to HEP to allow for muscle memory formation of deep-core first.   Total time: 60 min.                     Trigger Point Dry Needling - 02/28/20 0001    Consent Given?  Yes    Education Handout Provided  No    Muscles Treated Back/Hip  Erector spinae;Lumbar multifidi    Dry Needling Comments  bilateral    Erector spinae Response  Twitch response elicited;Palpable increased muscle length    Lumbar multifidi Response  Twitch response elicited;Palpable increased muscle length             PT Short Term Goals - 01/24/20 3299      PT SHORT TERM GOAL #1   Title  Patient will demonstrate a coordinated contraction, relaxation, and bulge of the pelvic floor muscles to demonstrate functional recruitment and motion and allow for further strengthening.    Baseline  Pt. history suggests poor PFM relaxation and contraction coordination    Time  8    Period  Weeks    Status  New    Target Date  03/20/20      PT  SHORT TERM GOAL #2   Title  Patient will demonstrate improved pelvic alignment and balance of musculature surrounding the pelvis to facilitate decreased PFM spasms and decrease pelvic pain.    Baseline  anterior pelvic tilt, RLE long by ~ 1 cm. spasms surrounding R>L, posterior>anterior pelvis    Time  5    Period  Weeks    Status  New    Target Date  02/28/20      PT SHORT TERM GOAL #3   Title  Patient will demonstrate improved sitting and standing posture to demonstrate learning and decrease stress on the pelvic floor with functional activity.    Baseline  "prop-sitting" due to 4'11" and anterior pelvic tilt.    Time  5    Period  Weeks    Status  New    Target Date  02/28/20      PT SHORT TERM GOAL #4   Title  Pt. will demonstrate effective use of urge-suppression tecnique at least 80% of the time to demonstrate improved PFM coordination and recruitment and allow for further reduction of UI.    Baseline  Pt. urinating as frequently as every 15 min. in the am. and up to 13+ times per day.    Time  5    Period  Weeks    Status  New    Target Date  02/28/20        PT Long Term Goals - 01/24/20 0834      PT LONG TERM GOAL #1   Title  Patient will report no episodes of SUI or UUI over the course of the prior two weeks to demonstrate improved functional ability.    Baseline  having UUI 13+ times/day and SUI with heavy cough/sneeze fit.    Time  10    Period  Weeks    Status  New    Target Date  04/03/20      PT LONG TERM GOAL #2   Title  Patient will describe pain no greater than 1/10 during walking for at least 30 min. to demonstrate improved functional ability and allow for increased activity for weight loss and overall health..    Baseline  Pain increases to 4/10 with activity.    Time  10    Period  Weeks    Status  New    Target Date  04/03/20      PT LONG TERM GOAL #3   Title  Patient will report urinating 6-8 times per day over the course of the prior week to  demonstrate decreased frequency.    Baseline  Pt. is urinating 13+ times per day, unable to sit through a one-hour meeting without going to the restroom.    Time  10    Period  Days    Status  New    Target Date  04/03/20      PT LONG TERM GOAL #4   Title  Pt. will improve in FOTO score by 10 points to demonstrate improved function.    Baseline  FOTO PFDI Urinary: 25, Urinary problem: 57    Time  10    Period  Weeks    Status  New    Target Date  04/03/20            Plan - 02/28/20 0917    Clinical Impression Statement  Pt. Responded well to all interventions today, demonstrating decreased spasm/TTP and improved coordination and recruitment of the deep-core as well as understanding and correct performance of all education and exercises provided today. They will continue to benefit from skilled physical therapy to work toward remaining goals and maximize function as well as decrease likelihood of symptom increase or recurrence.     PT Next Visit Plan  walking mechanics, standing posture, squat form/functional movement training, discuss walking program/exercise for weight loss if interested?    PT Home Exercise Plan  ergonomics, seated posture, posterior pelvic tilts (seated and supine), side-stretch, heel-lift, bladder irritants, fluid intake, urge suppression, low back stretch, hip-flexor stretch, piriformis stretch, posterior tilts with pillow, tools, arch crunches, toe scrunches, pedicure toe separators, foot release with ball, calf stretch, soda-can theory, sit-to-stand, supine to sit    Consulted and Agree with Plan of Care  Patient       Patient will benefit from skilled therapeutic intervention in order to improve the following deficits and impairments:     Visit Diagnosis: Sacrococcygeal disorders, not elsewhere  classified  Other muscle spasm  Abnormal posture     Problem List Patient Active Problem List   Diagnosis Date Noted  . Gall stones 12/10/2013   Willa Rough DPT, ATC Willa Rough 02/28/2020, 9:22 AM  [place] MAIN [facility] SERVICES [address] [place], [place], [address] Phone: [phone]   Fax:  [phone]  Name: LARETTA PYATT MRN: [id number] Date of Birth: 06/04/1971

## 2020-03-05 ENCOUNTER — Ambulatory Visit: Payer: 59

## 2020-03-12 ENCOUNTER — Ambulatory Visit: Payer: 59

## 2020-03-14 ENCOUNTER — Other Ambulatory Visit: Payer: Self-pay

## 2020-03-14 ENCOUNTER — Ambulatory Visit: Payer: 59 | Attending: Urology

## 2020-03-14 DIAGNOSIS — R293 Abnormal posture: Secondary | ICD-10-CM | POA: Insufficient documentation

## 2020-03-14 DIAGNOSIS — M62838 Other muscle spasm: Secondary | ICD-10-CM | POA: Diagnosis present

## 2020-03-14 DIAGNOSIS — M533 Sacrococcygeal disorders, not elsewhere classified: Secondary | ICD-10-CM

## 2020-03-14 NOTE — Therapy (Signed)
[facility] MAIN [facility] SERVICES [address] [place], [place], [address] Phone: [phone]   Fax:  [phone]  Physical Therapy Treatment  The patient has been informed of current processes in place at Outpatient Rehab to protect patients from Covid-19 exposure including social distancing, schedule modifications, and new cleaning procedures. After discussing their particular risk with a therapist based on the patient's personal risk factors, the patient has decided to proceed with in-person therapy.   Patient Details  Name: Gloria Baker MRN: [id number] Date of Birth: 10-13-1971 No data recorded  Encounter Date: 03/14/2020  PT End of Session - 03/14/20 0932    Visit Number  7    Number of Visits  10    Date for PT Re-Evaluation  04/02/20    Authorization Type  Aetna    Authorization - Visit Number  7    Authorization - Number of Visits  10    Progress Note Due on Visit  10    PT Start Time  0800    PT Stop Time  0900    PT Time Calculation (min)  60 min    Activity Tolerance  Patient tolerated treatment well;No increased pain    Behavior During Therapy  WFL for tasks assessed/performed       Past Medical History:  Diagnosis Date  . GERD (gastroesophageal reflux disease)   . Hypertension   . Irritable bowel syndrome     Past Surgical History:  Procedure Laterality Date  . ABDOMINAL HYSTERECTOMY    . CHOLECYSTECTOMY  01/06/2014  . COLONOSCOPY W/ BIOPSIES  May 30, 2011   random biopsies showed no evidence of colitis. [name], M.D.  . COLONOSCOPY WITH PROPOFOL N/A 10/17/2016   Procedure: COLONOSCOPY WITH PROPOFOL;  Surgeon: Christena Deem, MD;  Location: [facility] ENDOSCOPY;  Service: Endoscopy;  Laterality: N/A;  . ESOPHAGOGASTRODUODENOSCOPY (EGD) WITH PROPOFOL N/A 10/17/2016   Procedure: ESOPHAGOGASTRODUODENOSCOPY (EGD) WITH PROPOFOL;  Surgeon: Christena Deem, MD;  Location: [facility] ENDOSCOPY;  Service: Endoscopy;   Laterality: N/A;  . PARTIAL HYSTERECTOMY  1996  . UPPER GI ENDOSCOPY  May 30, 2011   Biopsy showed no malignancy  or active ulcer disease. Negative H. pylori. [name], M.D.    There were no vitals filed for this visit.  Pelvic Floor Physical Therapy Treatment Note  SCREENING  Changes in medications, allergies, or medical history?: none    SUBJECTIVE  Patient reports: Has been able to go only ~ 7-9 times per day. Has still been drinking her 2 cups of coffee but not drinking as much water with it, increases water intake after. Pain in RLB was when doing yard work again. Is doing 1-2 walks per week as 20-30 min. Or ~ 1-1.5 miles each time.   Precautions:  none  Pain update:  Location of pain: R LBP  Current pain: 0/10  Max pain: 4/10 (following yard work) Least pain: 0/10 Nature of pain:achy  **no pain following treatment.  Patient Goals: Not have to run to the bathroom all the time, be able to sit through an hour meeting comfortably. Be able to go on car-trips without fear.   OBJECTIVE  Changes in: Posture/Observations:  Anterior pelvic tilt, RLE long by ~ 1 cm. -with heel-lift PSIS appear level (from previous session)  Resting into hip and/of "locking" knees when standing.   Range of Motion/Flexibilty:   Strength/MMT:  LE MMT:  Pt. Demonstrates decreased stability in L SLS >R (from previous session)  L  hip flexion 3+/ 5, R hip flex:4/5, B knee flex, 4+/5, L Eversion: 4+/5, R Inversion: 4+/5, all others. B  Hip IR 3+/5, ER 4/5.   R ER/IR 80/40, L ER/IR 90/25 HS ~ 90 deg. B but tightness felt in L calf/hip flexor and R HS during stretch  Pelvic Floor (from 3-15) [External Exam: Introitus Appears: WNL Skin integrity: WNL Palpation: no TTP Cough: intact Prolapse visible?: yes with bearing down Scar mobility: needs further assessment  Internal Vaginal Exam: Strength (PERF): 4-/5 Symmetry: greatest TTP at L coccygeus Palpation: TTP  throughout Prolapse: posterior wall visible ~ 1 cm above the level of the introitus]  Abdominal:   Palpation: TTP to L tibialis anterior  Gait Analysis: Pt. Exhibits decreased control over descent of L foot going from heel strike into stance phase, not engaging core throughout gait cycle.   INTERVENTIONS THIS SESSION: Manual: Performed TP release to L anterior tibialis to decrease spasm and pain and allow for improved balance of musculature for improved gait and prevention of return of symptoms. Assessed hip ROM and IR/ER strength and LE strength for further POC development..  NM re-ed: Educated on and practiced standing posture, walking mechanics, and 1/2 squat mechanics with emphasis on recruiting appropriate musculature and re-learning appropriate patterns to prevent return of Sx and to minimize downward pressure on the pelvic organs and decrease POP/frequency/urgency.  Therex: educated on not holding onto the hand-rails with the treadmill, decreasing the speed PRN and focusing on using her core when walking for exercise.  Total time: 60 min.                              PT Short Term Goals - 01/24/20 1157      PT SHORT TERM GOAL #1   Title  Patient will demonstrate a coordinated contraction, relaxation, and bulge of the pelvic floor muscles to demonstrate functional recruitment and motion and allow for further strengthening.    Baseline  Pt. history suggests poor PFM relaxation and contraction coordination    Time  8    Period  Weeks    Status  New    Target Date  03/20/20      PT SHORT TERM GOAL #2   Title  Patient will demonstrate improved pelvic alignment and balance of musculature surrounding the pelvis to facilitate decreased PFM spasms and decrease pelvic pain.    Baseline  anterior pelvic tilt, RLE long by ~ 1 cm. spasms surrounding R>L, posterior>anterior pelvis    Time  5    Period  Weeks    Status  New    Target Date  02/28/20      PT  SHORT TERM GOAL #3   Title  Patient will demonstrate improved sitting and standing posture to demonstrate learning and decrease stress on the pelvic floor with functional activity.    Baseline  "prop-sitting" due to 4'11" and anterior pelvic tilt.    Time  5    Period  Weeks    Status  New    Target Date  02/28/20      PT SHORT TERM GOAL #4   Title  Pt. will demonstrate effective use of urge-suppression tecnique at least 80% of the time to demonstrate improved PFM coordination and recruitment and allow for further reduction of UI.    Baseline  Pt. urinating as frequently as every 15 min. in the am. and up to 13+ times per day.  Time  5    Period  Weeks    Status  New    Target Date  02/28/20        PT Long Term Goals - 01/24/20 0834      PT LONG TERM GOAL #1   Title  Patient will report no episodes of SUI or UUI over the course of the prior two weeks to demonstrate improved functional ability.    Baseline  having UUI 13+ times/day and SUI with heavy cough/sneeze fit.    Time  10    Period  Weeks    Status  New    Target Date  04/03/20      PT LONG TERM GOAL #2   Title  Patient will describe pain no greater than 1/10 during walking for at least 30 min. to demonstrate improved functional ability and allow for increased activity for weight loss and overall health..    Baseline  Pain increases to 4/10 with activity.    Time  10    Period  Weeks    Status  New    Target Date  04/03/20      PT LONG TERM GOAL #3   Title  Patient will report urinating 6-8 times per day over the course of the prior week to demonstrate decreased frequency.    Baseline  Pt. is urinating 13+ times per day, unable to sit through a one-hour meeting without going to the restroom.    Time  10    Period  Days    Status  New    Target Date  04/03/20      PT LONG TERM GOAL #4   Title  Pt. will improve in FOTO score by 10 points to demonstrate improved function.    Baseline  FOTO PFDI Urinary: 25,  Urinary problem: 57    Time  10    Period  Weeks    Status  New    Target Date  04/03/20            Plan - 03/14/20 0933    Clinical Impression Statement  Pt. Responded well to all interventions today, demonstrating improved walking mechanics, decreased spasm, as well as understanding and correct performance of all education and exercises provided today. They will continue to benefit from skilled physical therapy to work toward remaining goals and maximize function as well as decrease likelihood of symptom increase or recurrence.     PT Next Visit Plan  Do MFR to B posterio-lateral hips and LB to decrease pain in thigh with piriformis stretch, review squat form/functional movement training, deepen squat if appropriate, watch pt. on a treadmill to verify implementing suggestions correctly.    PT Home Exercise Plan  ergonomics, seated posture, posterior pelvic tilts (seated and supine), side-stretch, heel-lift, bladder irritants, fluid intake, urge suppression, low back stretch, hip-flexor stretch, piriformis stretch, posterior tilts with pillow, tools, arch crunches, toe scrunches, pedicure toe separators, foot release with ball, calf stretch, soda-can theory, sit-to-stand, supine to sit, standing and walking posture/mechanics, squat form    Consulted and Agree with Plan of Care  Patient       Patient will benefit from skilled therapeutic intervention in order to improve the following deficits and impairments:     Visit Diagnosis: Sacrococcygeal disorders, not elsewhere classified  Other muscle spasm  Abnormal posture     Problem List Patient Active Problem List   Diagnosis Date Noted  . Gall stones 12/10/2013   Willa Rough DPT,  ATC Cleophus Molt 03/14/2020, 9:39 AM  [place] [facility] MAIN [facility] SERVICES [address] [place], [place], [address] Phone: [phone]   Fax:  [phone]  Name: Gloria Baker MRN: [id number] Date of  Birth: 1971/11/07

## 2020-03-14 NOTE — Patient Instructions (Signed)
  Keep your trunk as one unit and let it hinge forward from the hips as you push your bottom back and bend your knees at the same rate that you bend your hips. Keep your weight back toward your heels but do not actually lift the toes off the ground. Exhale starting just before and all the way through standing to help engage the glutes and lower tummy muscles. Do 1/2 depth 2x15.    Listen to make sure L foot not dropping, pull up tall through the crown of your head, make sure knees are not locked. Feel 50/50 glutes and lower tummy.   Remember to look for knots in your calves as well.

## 2020-03-19 ENCOUNTER — Ambulatory Visit: Payer: 59

## 2020-03-19 ENCOUNTER — Other Ambulatory Visit: Payer: Self-pay

## 2020-03-19 DIAGNOSIS — M533 Sacrococcygeal disorders, not elsewhere classified: Secondary | ICD-10-CM | POA: Diagnosis not present

## 2020-03-19 DIAGNOSIS — R293 Abnormal posture: Secondary | ICD-10-CM

## 2020-03-19 DIAGNOSIS — M62838 Other muscle spasm: Secondary | ICD-10-CM

## 2020-03-19 NOTE — Therapy (Signed)
[place] MA[facility] SERVICES [address] [place], [place], [address] Phone: [phone]   Fax:  [phone]  Physical Therapy Treatment  The patient has been informed of current processes in place at Outpatient Rehab to protect patients from Covid-19 exposure including social distancing, schedule modifications, and new cleaning procedures. After discussing their particular risk with a therapist based on the patient's personal risk factors, the patient has decided to proceed with in-person therapy.   Patient Details  Name: MAHSA HANSER MRN: [id number] Date of Birth: Apr 05, 1971 No data recorded  Encounter Date: 03/19/2020  PT End of Session - 03/19/20 1129    Visit Number  8    Number of Visits  10    Date for PT Re-Evaluation  04/02/20    Authorization Type  [employer]    Authorization - Visit Number  8    Authorization - Number of Visits  10    Progress Note Due on Visit  10    PT Start Time  0730    PT Stop Time  0830    PT Time Calculation (min)  60 min    Activity Tolerance  Patient tolerated treatment well;No increased pain    Behavior During Therapy  WFL for tasks assessed/performed       Past Medical History:  Diagnosis Date  . GERD (gastroesophageal reflux disease)   . Hypertension   . Irritable bowel syndrome     Past Surgical History:  Procedure Laterality Date  . ABDOMINAL HYSTERECTOMY    . CHOLECYSTECTOMY  01/06/2014  . COLONOSCOPY W/ BIOPSIES  May 30, 2011   random biopsies showed no evidence of colitis. Loistine Simas, M.D.  . COLONOSCOPY WITH PROPOFOL N/A 10/17/2016   Procedure: COLONOSCOPY WITH PROPOFOL;  Surgeon: Lollie Sails, MD;  Location: [facility] ENDOSCOPY;  Service: Endoscopy;  Laterality: N/A;  . ESOPHAGOGASTRODUODENOSCOPY (EGD) WITH PROPOFOL N/A 10/17/2016   Procedure: ESOPHAGOGASTRODUODENOSCOPY (EGD) WITH PROPOFOL;  Surgeon: Lollie Sails, MD;  Location: [facility] ENDOSCOPY;  Service: Endoscopy;   Laterality: N/A;  . PARTIAL HYSTERECTOMY  1996  . UPPER GI ENDOSCOPY  May 30, 2011   Biopsy showed no malignancy  or active ulcer disease. Negative H. pylori. Loistine Simas, M.D.    There were no vitals filed for this visit.  Pelvic Floor Physical Therapy Treatment Note  SCREENING  Changes in medications, allergies, or medical history?: none    SUBJECTIVE  Patient reports: Never knew how much she locked her knees, has noticed that she stands with her weight into her hip, not straight up. Is working on fixing her body mechanics with yard work, [employer], [employer]. Is stiff when she gets up. Still feels tightness in the lateral thigh when she tries to cross her leg.  Precautions:  none  Pain update:  Location of pain: R LBP  Current pain: 0/10  Max pain: 4/10 (following yard work) Least pain: 0/10 Nature of pain:achy  **no increased pain, change in where tension/pain felt from posterolateral to antero-lateral following treatment.  Patient Goals: Not have to run to the bathroom all the time, be able to sit through an hour meeting comfortably. Be able to go on car-trips without fear.   OBJECTIVE  Changes in: Posture/Observations:  Anterior pelvic tilt, RLE long by ~ 1 cm. -with heel-lift PSIS appear level (from previous session)   Range of Motion/Flexibilty:  Able to more easily cross legs in figure-4 following treatment and has discomfort now more toward the anterior hip rather than lateral  when reaches stretch.  Strength/MMT:  LE MMT:  Pt. Demonstrates decreased stability in L SLS >R (from previous session)  L hip flexion 3+/ 5, R hip flex:4/5, B knee flex, 4+/5, L Eversion: 4+/5, R Inversion: 4+/5, all others. B  Hip IR 3+/5, ER 4/5.  R ER/IR 80/40, L ER/IR 90/25 HS ~ 90 deg. B but tightness felt in L calf/hip flexor and R HS during stretch (from previous session)  Pelvic Floor (from 3-15) [External Exam: Introitus Appears: WNL Skin integrity: WNL Palpation: no  TTP Cough: intact Prolapse visible?: yes with bearing down Scar mobility: needs further assessment  Internal Vaginal Exam: Strength (PERF): 4-/5 Symmetry: greatest TTP at L coccygeus Palpation: TTP throughout Prolapse: posterior wall visible ~ 1 cm above the level of the introitus]  Abdominal:   Palpation: TTP to L Glute Med and Max, B L5 multifidus.  Gait Analysis: Pt. Exhibits decreased control over descent of L foot going from heel strike into stance phase, not engaging core throughout gait cycle. (from previous session)  INTERVENTIONS THIS SESSION: Manual: Performed TP release toL Glute Med and Max, B L5 multifidus to decrease spasm and pain and allow for improved balance of musculature for improved gait and prevention of return of symptoms. Assessed hip ROM and IR/ER strength and LE strength for further POC development. Performed cupping/MFR to B lateral thigh along IT band, through ~ L3-5, and B lateral/posterior buttock to improve fascial mobility and allow improved ROM without increased pain and decreased pressure on neurologic and myofascial structures to improve quality of movement and prevent pain recurrence.  Total time: 60 min.                                 PT Short Term Goals - 01/24/20 2683      PT SHORT TERM GOAL #1   Title  Patient will demonstrate a coordinated contraction, relaxation, and bulge of the pelvic floor muscles to demonstrate functional recruitment and motion and allow for further strengthening.    Baseline  Pt. history suggests poor PFM relaxation and contraction coordination    Time  8    Period  Weeks    Status  New    Target Date  03/20/20      PT SHORT TERM GOAL #2   Title  Patient will demonstrate improved pelvic alignment and balance of musculature surrounding the pelvis to facilitate decreased PFM spasms and decrease pelvic pain.    Baseline  anterior pelvic tilt, RLE long by ~ 1 cm. spasms surrounding R>L,  posterior>anterior pelvis    Time  5    Period  Weeks    Status  New    Target Date  02/28/20      PT SHORT TERM GOAL #3   Title  Patient will demonstrate improved sitting and standing posture to demonstrate learning and decrease stress on the pelvic floor with functional activity.    Baseline  "prop-sitting" due to 4'11" and anterior pelvic tilt.    Time  5    Period  Weeks    Status  New    Target Date  02/28/20      PT SHORT TERM GOAL #4   Title  Pt. will demonstrate effective use of urge-suppression tecnique at least 80% of the time to demonstrate improved PFM coordination and recruitment and allow for further reduction of UI.    Baseline  Pt. urinating as frequently as every  15 min. in the am. and up to 13+ times per day.    Time  5    Period  Weeks    Status  New    Target Date  02/28/20        PT Long Term Goals - 01/24/20 0834      PT LONG TERM GOAL #1   Title  Patient will report no episodes of SUI or UUI over the course of the prior two weeks to demonstrate improved functional ability.    Baseline  having UUI 13+ times/day and SUI with heavy cough/sneeze fit.    Time  10    Period  Weeks    Status  New    Target Date  04/03/20      PT LONG TERM GOAL #2   Title  Patient will describe pain no greater than 1/10 during walking for at least 30 min. to demonstrate improved functional ability and allow for increased activity for weight loss and overall health..    Baseline  Pain increases to 4/10 with activity.    Time  10    Period  Weeks    Status  New    Target Date  04/03/20      PT LONG TERM GOAL #3   Title  Patient will report urinating 6-8 times per day over the course of the prior week to demonstrate decreased frequency.    Baseline  Pt. is urinating 13+ times per day, unable to sit through a one-hour meeting without going to the restroom.    Time  10    Period  Days    Status  New    Target Date  04/03/20      PT LONG TERM GOAL #4   Title  Pt. will  improve in FOTO score by 10 points to demonstrate improved function.    Baseline  FOTO PFDI Urinary: 25, Urinary problem: 57    Time  10    Period  Weeks    Status  New    Target Date  04/03/20            Plan - 03/19/20 1129    Clinical Impression Statement  Pt. Responded well to all interventions today, demonstrating improved myofascial mobility through low back and lateral thigh as well as increased hip ER ROM before increased discomfort, a shift of where discomfort felt, as well as understanding and correct performance of all education and exercises provided today. They will continue to benefit from skilled physical therapy to work toward remaining goals and maximize function as well as decrease likelihood of symptom increase or recurrence.    PT Next Visit Plan  Do MFR to B anterio-lateral hips and thighs to decrease pain in thigh with piriformis stretch, review squat form/functional movement training, deepen squat if appropriate, watch pt. on a treadmill to verify implementing suggestions correctly.    PT Home Exercise Plan  ergonomics, seated posture, posterior pelvic tilts (seated and supine), side-stretch, heel-lift, bladder irritants, fluid intake, urge suppression, low back stretch, hip-flexor stretch, piriformis stretch, posterior tilts with pillow, tools, arch crunches, toe scrunches, pedicure toe separators, foot release with ball, calf stretch, soda-can theory, sit-to-stand, supine to sit, standing and walking posture/mechanics, squat form    Consulted and Agree with Plan of Care  Patient       Patient will benefit from skilled therapeutic intervention in order to improve the following deficits and impairments:     Visit Diagnosis: Sacrococcygeal disorders, not elsewhere  classified  Other muscle spasm  Abnormal posture     Problem List Patient Active Problem List   Diagnosis Date Noted  . Gall stones 12/10/2013   Cleophus Molt DPT, ATC Cleophus Molt 03/19/2020, 11:37 AM  [place] [facility] MAIN [facility] SERVICES [address] [place], [place], [address] Phone: [phone]   Fax:  [phone]  Name: TYASHIA MORRISETTE MRN: [id number] Date of Birth: Jun 03, 1971

## 2020-03-29 ENCOUNTER — Other Ambulatory Visit: Payer: Self-pay

## 2020-03-29 ENCOUNTER — Ambulatory Visit: Payer: 59

## 2020-03-29 DIAGNOSIS — M533 Sacrococcygeal disorders, not elsewhere classified: Secondary | ICD-10-CM

## 2020-03-29 DIAGNOSIS — M62838 Other muscle spasm: Secondary | ICD-10-CM

## 2020-03-29 DIAGNOSIS — R293 Abnormal posture: Secondary | ICD-10-CM

## 2020-03-29 NOTE — Therapy (Signed)
[place] [facility] MAIN [facility] SERVICES [address] [place], [place], [address] Phone: [phone]   Fax:  [phone]  Physical Therapy Treatment  The patient has been informed of current processes in place at Outpatient Rehab to protect patients from Covid-19 exposure including social distancing, schedule modifications, and new cleaning procedures. After discussing their particular risk with a therapist based on the patient's personal risk factors, the patient has decided to proceed with in-person therapy.   Patient Details  Name: Gloria Baker MRN: [id number] Date of Birth: June 18, 1971 No data recorded  Encounter Date: 03/29/2020  PT End of Session - 03/29/20 0859    Visit Number  8    Number of Visits  10    Date for PT Re-Evaluation  04/02/20    Authorization Type  Aetna    Authorization - Visit Number  9    Authorization - Number of Visits  10    Progress Note Due on Visit  10    PT Start Time  0800    PT Stop Time  0900    PT Time Calculation (min)  60 min    Activity Tolerance  Patient tolerated treatment well;No increased pain    Behavior During Therapy  WFL for tasks assessed/performed       Past Medical History:  Diagnosis Date  . GERD (gastroesophageal reflux disease)   . Hypertension   . Irritable bowel syndrome     Past Surgical History:  Procedure Laterality Date  . ABDOMINAL HYSTERECTOMY    . CHOLECYSTECTOMY  01/06/2014  . COLONOSCOPY W/ BIOPSIES  May 30, 2011   random biopsies showed no evidence of colitis. Gloria Baker, M.D.  . COLONOSCOPY WITH PROPOFOL N/A 10/17/2016   Procedure: COLONOSCOPY WITH PROPOFOL;  Surgeon: Gloria Deem, MD;  Location: [facility] ENDOSCOPY;  Service: Endoscopy;  Laterality: N/A;  . ESOPHAGOGASTRODUODENOSCOPY (EGD) WITH PROPOFOL N/A 10/17/2016   Procedure: ESOPHAGOGASTRODUODENOSCOPY (EGD) WITH PROPOFOL;  Surgeon: Gloria Deem, MD;  Location: [facility] ENDOSCOPY;  Service: Endoscopy;   Laterality: N/A;  . PARTIAL HYSTERECTOMY  1996  . UPPER GI ENDOSCOPY  May 30, 2011   Biopsy showed no malignancy  or active ulcer disease. Negative H. pylori. Gloria Baker, M.D.    There were no vitals filed for this visit.  Pelvic Floor Physical Therapy Treatment Note  SCREENING  Changes in medications, allergies, or medical history?: none    SUBJECTIVE  Patient reports: Has missed a couple days of exercise due to a crazy week. Can tell that she is able to do a better pelvic tilt. Is now really focusing on her walking. Has noticed that she rests into her hip a lot still. Pain seems to be worst when she is sitting for a long time. When she stands she feels stiff/pain in LB and hip-flexors. Still going every hour around lunch after second cup of coffee and water. Going 6-9 times total throughout the day.   Precautions:  none  Pain update:  Location of pain: R LBP  Current pain: 0/10  Max pain: 2/10  Least pain: 0/10 Nature of pain:achy  **no increased pain, decreased sharp sensation with hip-flexor stretch following treatment.  Patient Goals: Not have to run to the bathroom all the time, be able to sit through an hour meeting comfortably. Be able to go on car-trips without fear.   OBJECTIVE  Changes in: Posture/Observations:  Anterior pelvic tilt, RLE long by ~ 1 cm. -with heel-lift PSIS appear level (from previous session)  Range of Motion/Flexibilty:  Increased hip EXT ROM before stretch sensation and no "sharp" sensation following TPDN today.  R ER/IR 80/40, L ER/IR 90/25 HS ~ 90 deg. B but tightness felt in L calf/hip flexor and R HS during stretch (from previous session)  Strength/MMT:  LE MMT:  Pt. Demonstrates decreased stability in L SLS >R (from previous session)  L hip flexion 3+/ 5, R hip flex:4/5, B knee flex, 4+/5, L Eversion: 4+/5, R Inversion: 4+/5, all others. B  Hip IR 3+/5, ER 4/5. (from prior session    Pelvic Floor (from  3-15) [External Exam: Introitus Appears: WNL Skin integrity: WNL Palpation: no TTP Cough: intact Prolapse visible?: yes with bearing down Scar mobility: needs further assessment  Internal Vaginal Exam: Strength (PERF): 4-/5 Symmetry: greatest TTP at L coccygeus Palpation: TTP throughout Prolapse: posterior wall visible ~ 1 cm above the level of the introitus]  Abdominal:   Palpation: TTP to B Iliacus  Gait Analysis: Pt. Exhibits decreased control over descent of L foot going from heel strike into stance phase, not engaging core throughout gait cycle. (from previous session)  INTERVENTIONS THIS SESSION: Manual: Performed TP release and STM to B Iliacus to decrease spasm and pain and allow for improved balance of musculature for improved function and decreased symptoms.  Dry-needle: Performed TPDN with a .30x112mm needle and standard approach as described below to decrease spasm and pain and allow for improved balance of musculature for improved function and decreased symptoms.  Therex: Practiced seated hip-flexor stretch as pre-and post-treatment test as well as to allow for increased length of B hip-flexors to allow for decreased pressure on genitofemoral nerves and decreased pain and SUI.  Total time: 60 min.                              PT Short Term Goals - 01/24/20 5809      PT SHORT TERM GOAL #1   Title  Patient will demonstrate a coordinated contraction, relaxation, and bulge of the pelvic floor muscles to demonstrate functional recruitment and motion and allow for further strengthening.    Baseline  Pt. history suggests poor PFM relaxation and contraction coordination    Time  8    Period  Weeks    Status  New    Target Date  03/20/20      PT SHORT TERM GOAL #2   Title  Patient will demonstrate improved pelvic alignment and balance of musculature surrounding the pelvis to facilitate decreased PFM spasms and decrease pelvic pain.     Baseline  anterior pelvic tilt, RLE long by ~ 1 cm. spasms surrounding R>L, posterior>anterior pelvis    Time  5    Period  Weeks    Status  New    Target Date  02/28/20      PT SHORT TERM GOAL #3   Title  Patient will demonstrate improved sitting and standing posture to demonstrate learning and decrease stress on the pelvic floor with functional activity.    Baseline  "prop-sitting" due to 4'11" and anterior pelvic tilt.    Time  5    Period  Weeks    Status  New    Target Date  02/28/20      PT SHORT TERM GOAL #4   Title  Pt. will demonstrate effective use of urge-suppression tecnique at least 80% of the time to demonstrate improved PFM coordination and recruitment and allow for further  reduction of UI.    Baseline  Pt. urinating as frequently as every 15 min. in the am. and up to 13+ times per day.    Time  5    Period  Weeks    Status  New    Target Date  02/28/20        PT Long Term Goals - 01/24/20 0834      PT LONG TERM GOAL #1   Title  Patient will report no episodes of SUI or UUI over the course of the prior two weeks to demonstrate improved functional ability.    Baseline  having UUI 13+ times/day and SUI with heavy cough/sneeze fit.    Time  10    Period  Weeks    Status  New    Target Date  04/03/20      PT LONG TERM GOAL #2   Title  Patient will describe pain no greater than 1/10 during walking for at least 30 min. to demonstrate improved functional ability and allow for increased activity for weight loss and overall health..    Baseline  Pain increases to 4/10 with activity.    Time  10    Period  Weeks    Status  New    Target Date  04/03/20      PT LONG TERM GOAL #3   Title  Patient will report urinating 6-8 times per day over the course of the prior week to demonstrate decreased frequency.    Baseline  Pt. is urinating 13+ times per day, unable to sit through a one-hour meeting without going to the restroom.    Time  10    Period  Days    Status  New     Target Date  04/03/20      PT LONG TERM GOAL #4   Title  Pt. will improve in FOTO score by 10 points to demonstrate improved function.    Baseline  FOTO PFDI Urinary: 25, Urinary problem: 57    Time  10    Period  Weeks    Status  New    Target Date  04/03/20            Plan - 03/29/20 0913    Clinical Impression Statement  Pt. Responded well to all interventions today, demonstrating improved hip EXT ROM and decreased TTP at B Iliacus as well as understanding and correct performance of all education and exercises provided today. They will continue to benefit from skilled physical therapy to work toward remaining goals and maximize function as well as decrease likelihood of symptom increase or recurrence.     PT Next Visit Plan  Re-assess goals and re-cert, Do MFR to B anterio-lateral hips and thighs to decrease pain in thigh with piriformis stretch, review squat form/functional movement training, deepen squat if appropriate, watch pt. on a treadmill to verify implementing suggestions correctly.    PT Home Exercise Plan  ergonomics, seated posture, posterior pelvic tilts (seated and supine), side-stretch, heel-lift, bladder irritants, fluid intake, urge suppression, low back stretch, hip-flexor stretch, piriformis stretch, posterior tilts with pillow, tools, arch crunches, toe scrunches, pedicure toe separators, foot release with ball, calf stretch, soda-can theory, sit-to-stand, supine to sit, standing and walking posture/mechanics, squat form    Consulted and Agree with Plan of Care  Patient       Patient will benefit from skilled therapeutic intervention in order to improve the following deficits and impairments:     Visit Diagnosis:  Sacrococcygeal disorders, not elsewhere classified  Other muscle spasm  Abnormal posture     Problem List Patient Active Problem List   Diagnosis Date Noted  . Gall stones 12/10/2013   Cleophus Molt DPT, ATC Cleophus Molt 03/29/2020,  9:14 AM  [place] [facility] MAIN [facility] SERVICES [address] [place], [place], [address] Phone: [phone]   Fax:  [phone]  Name: Gloria Baker MRN: [id number] Date of Birth: Feb 13, 1971

## 2020-04-03 ENCOUNTER — Ambulatory Visit: Payer: No Typology Code available for payment source | Attending: Urology

## 2020-04-03 ENCOUNTER — Other Ambulatory Visit: Payer: Self-pay

## 2020-04-03 DIAGNOSIS — M533 Sacrococcygeal disorders, not elsewhere classified: Secondary | ICD-10-CM | POA: Insufficient documentation

## 2020-04-03 DIAGNOSIS — M62838 Other muscle spasm: Secondary | ICD-10-CM | POA: Diagnosis present

## 2020-04-03 DIAGNOSIS — R293 Abnormal posture: Secondary | ICD-10-CM | POA: Diagnosis present

## 2020-04-03 NOTE — Patient Instructions (Signed)
(  CHOOSE ONE OF THE BELOW)    Bring feet together and let the knees fall out to the sides. Hold for 5 deep breaths, rest then repeat 2 more times.   Take 5 deep breaths, allowing gravity to gently pull you into a deeper stretch with each breath. Repeat 2-3 times, once a day.   (CHOOSE ONE OF THE BELOW)  Seated -in chair hamstring stretch    Perform 2 sets of 10 belly breaths to both sides. Repeat 1-2/day   Action: Stick on leg out, with toes pointing up towards your nose and knee straight. With abdominals engaged, sitting up right, gently lean forward till you feel a gentle stretch at the back of your leg.     Laying Down- Hamstring stretch   Perform 2 sets of 10 belly breaths to both sides. Repeat 1-2/ day   Action: While laying down on a flat surface, bend one knee. Take a sheet, strap, or belt and lift the other leg straight (toes towards the nose) pulling the leg up until you feel a gentle stretch in the back of your leg/ low glutes.       Standing- Hamstring Stretch   Perform 3 sets of 5 belly breaths; repeat 1-2/ day   Action: With feet facing forward and about shoulder width apart. Tuck the hips under (engage abdominals), and slowly bend towards the ground with knees straight but not locked out. The goal is to feel a gentle stretch in the back of your legs, you do not have to touch the ground.

## 2020-04-03 NOTE — Therapy (Addendum)
[place] MAIN [facility] SERVICES [address] [place], [place], [address] Phone: [phone]   Fax:  [phone]  Physical Therapy Treatment  The patient has been informed of current processes in place at Outpatient Rehab to protect patients from Covid-19 exposure including social distancing, schedule modifications, and new cleaning procedures. After discussing their particular risk with a therapist based on the patient's personal risk factors, the patient has decided to proceed with in-person therapy.   Patient Details  Name: Gloria Baker MRN: [id number] Date of Birth: [birth year] No data recorded  Encounter Date: 04/03/2020  PT End of Session - 04/03/20 0730    Visit Number  10    Number of Visits  10    Date for PT Re-Evaluation  04/02/20    Authorization Type  [employer]    Authorization - Visit Number  10    Authorization - Number of Visits  10    Progress Note Due on Visit  10    PT Start Time  0730    Activity Tolerance  Patient tolerated treatment well;No increased pain    Behavior During Therapy  WFL for tasks assessed/performed       Past Medical History:  Diagnosis Date  . GERD (gastroesophageal reflux disease)   . Hypertension   . Irritable bowel syndrome     Past Surgical History:  Procedure Laterality Date  . ABDOMINAL HYSTERECTOMY    . CHOLECYSTECTOMY  01/06/2014  . COLONOSCOPY W/ BIOPSIES  May 30, 2011   random biopsies showed no evidence of colitis. Loistine Simas, M.D.  . COLONOSCOPY WITH PROPOFOL N/A 10/17/2016   Procedure: COLONOSCOPY WITH PROPOFOL;  Surgeon: Lollie Sails, MD;  Location: [facility] ENDOSCOPY;  Service: Endoscopy;  Laterality: N/A;  . ESOPHAGOGASTRODUODENOSCOPY (EGD) WITH PROPOFOL N/A 10/17/2016   Procedure: ESOPHAGOGASTRODUODENOSCOPY (EGD) WITH PROPOFOL;  Surgeon: Lollie Sails, MD;  Location: [facility] ENDOSCOPY;  Service: Endoscopy;  Laterality: N/A;  . PARTIAL HYSTERECTOMY  1996  . UPPER GI  ENDOSCOPY  May 30, 2011   Biopsy showed no malignancy  or active ulcer disease. Negative H. pylori. Loistine Simas, M.D.    There were no vitals filed for this visit.   Pelvic Floor Physical Therapy Treatment Note  SCREENING  Changes in medications, allergies, or medical history?: none    SUBJECTIVE  Patient reports: Saturday she had 1 cup of coffee, went to a soccer game, had a can of soda, had another drink, didn't go to the bathroom until 3:30. Friday was good. Is going 9-12 times to urinate during the day. Is not having pain with standing, able to feel stretch "under her thigh" when she does fiquer 4 stretch, now not just in the front but Still not feeling it in her piriformis. Back pain increased from moving furniture and doing yard work.  Precautions:  none  Pain update:  Location of pain: R LBP  Current pain: 0/10  Max pain: 4/10  Least pain: 0/10 Nature of pain:achy  **no increased pain, Able to feel stretch in the piriformis following treatment!  Patient Goals: Not have to run to the bathroom all the time, be able to sit through an hour meeting comfortably. Be able to go on car-trips without fear.   OBJECTIVE  Changes in: Posture/Observations:  Anterior pelvic tilt, RLE long by ~ 1 cm. -with heel-lift PSIS appear level (from previous session)   Range of Motion/Flexibilty:  R ER/IR 80/40, L ER/IR 90/25 HS ~ 90 deg. B but tightness felt  in L calf/hip flexor and R HS during stretch (from previous session)  Pt. Had pulling in HS/adductors when attempting seated piriformis stretch pre-treatment.  **Able to feel stretch appropriately following treatment.  Strength/MMT:  LE MMT:  Pt. Demonstrates decreased stability in L SLS >R (from previous session)  L hip flexion 3+/ 5, R hip flex:4/5, B knee flex, 4+/5, L Eversion: 4+/5, R Inversion: 4+/5, all others. B  Hip IR 3+/5, ER 4/5. (from prior session)    Pelvic Floor (from 3-15) [External  Exam: Introitus Appears: WNL Skin integrity: WNL Palpation: no TTP Cough: intact Prolapse visible?: yes with bearing down Scar mobility: needs further assessment  Internal Vaginal Exam: Strength (PERF): 4-/5 Symmetry: greatest TTP at L coccygeus Palpation: TTP throughout Prolapse: posterior wall visible ~ 1 cm above the level of the introitus]  Abdominal:   Palpation: TTP to B gracilis, Adductor magnus and semimembranosus   Gait Analysis: Pt. Exhibits decreased control over descent of L foot going from heel strike into stance phase, not engaging core throughout gait cycle. (from previous session)  INTERVENTIONS THIS SESSION: Manual: Performed TP release and STM to B gracilis, Adductor magnus and semimembranosus  to decrease spasm and pain and allow for improved balance of musculature for improved function and decreased symptoms.  Therex: Practiced seated seated piriformis stretch as pre-and post-treatment test and educated on and practiced seated HS stretch and butterfly stretch, educated on alternates To maintain and improve muscle length and allow for improved balance of musculature for long-term symptom relief.  Self-care: re-assessed and discussed goals and POC moving forward.  Total time: 60 min.                           PT Short Term Goals - 04/03/20 0741      PT SHORT TERM GOAL #1   Title  Patient will demonstrate a coordinated contraction, relaxation, and bulge of the pelvic floor muscles to demonstrate functional recruitment and motion and allow for further strengthening.    Baseline  Pt. history suggests poor PFM relaxation and contraction coordination    Time  8    Period  Weeks    Status  Achieved    Target Date  03/20/20      PT SHORT TERM GOAL #2   Title  Patient will demonstrate improved pelvic alignment and balance of musculature surrounding the pelvis to facilitate decreased PFM spasms and decrease pelvic pain.    Baseline   anterior pelvic tilt, RLE long by ~ 1 cm. spasms surrounding R>L, posterior>anterior pelvis    Time  5    Period  Weeks    Status  Achieved    Target Date  02/28/20      PT SHORT TERM GOAL #3   Title  Patient will demonstrate improved sitting and standing posture to demonstrate learning and decrease stress on the pelvic floor with functional activity.    Baseline  "prop-sitting" due to 4'11" and anterior pelvic tilt.    Time  5    Period  Weeks    Status  Achieved    Target Date  02/28/20      PT SHORT TERM GOAL #4   Title  Pt. will demonstrate effective use of urge-suppression tecnique at least 80% of the time to demonstrate improved PFM coordination and recruitment and allow for further reduction of UI.    Baseline  Pt. urinating as frequently as every 15 min. in the am. and  up to 13+ times per day.    Time  5    Period  Weeks    Status  Achieved    Target Date  02/28/20        PT Long Term Goals - 04/03/20 0743      PT LONG TERM GOAL #1   Title  Patient will report no episodes of SUI or UUI over the course of the prior two weeks to demonstrate improved functional ability.    Baseline  having UUI 13+ times/day and SUI with heavy cough/sneeze fit.    Time  10    Period  Weeks    Status  Achieved    Target Date  04/03/20      PT LONG TERM GOAL #2   Title  Patient will describe pain no greater than 1/10 during walking for at least 30 min. to demonstrate improved functional ability and allow for increased activity for weight loss and overall health..    Baseline  Pain increases to 4/10 with activity.    Time  10    Period  Weeks    Status  Achieved    Target Date  04/03/20      PT LONG TERM GOAL #3   Title  Patient will report urinating 6-8 times per day over the course of the prior week to demonstrate decreased frequency.    Baseline  Pt. is urinating 13+ times per day, unable to sit through a one-hour meeting without going to the restroom. As of 5/4: Pt going 9-12 times  per day    Time  10    Period  Days    Status  On-going    Target Date  06/26/20      PT LONG TERM GOAL #4   Title  Pt. will improve in FOTO score by 10 points to demonstrate improved function.    Baseline  FOTO PFDI Urinary: 25, Urinary problem: 57 As of 5/4: PFDI Urinary: 4 (21 pts) Urinary Problem: 75 (18pts)    Time  10    Period  Weeks    Status  Achieved    Target Date  04/03/20      PT LONG TERM GOAL #5   Title  Patient will demonstrate HEP x1 in the clinic without cueing to demonstrate understanding and proper form to allow for further improvement following D/C.    Baseline  Pt. lacks knowledge of long-term strengthening program for sustained Sx. reduction    Time  12    Period  Weeks    Status  New    Target Date  06/26/20      Additional Long Term Goals   Additional Long Term Goals  Yes      PT LONG TERM GOAL #6   Title  To not have LBP greater than 2/10 after ~ 6 "active" hours over the course of the day. e.g disney and gardeing etc.    Baseline  pain increases to 4/10 with a full day of gardening/activity    Time  12    Period  Weeks    Status  New    Target Date  06/26/20            Plan - 04/03/20 0732    Clinical Impression Statement  Pt. has met or surpassed most of her goals but continues to have some urinary frequency/urge and LBP after doing yardwork or being active for ~ 4-6 hours. She continues to have imbalance of the musculature  surrounding the pelvis, though it is improveing and will benefit from a strong HEP to minimize/correct these imbalances to allow for long-term improvement maintenence and further reduction of pain and frequency. We will continue for 12 more weeks at an every-other-week basis to allow sufficient time to see improvements from strengthening while continuing to manage spasms and up-grade exercises as appropriate for maximal improvement.    Personal Factors and Comorbidities  Fitness;Profession;Comorbidity 3+    Comorbidities  IBS,  Obesity, GERD, HTN    Examination-Activity Limitations  Continence;Bend;Locomotion Level;Stand;Lift;Sit;Transfers    Examination-Participation Restrictions  Community Activity;Laundry;Yard Work    Stability/Clinical Decision Making  Unstable/Unpredictable    Designer, jewellery  High    Rehab Potential  Good    PT Frequency  Biweekly    PT Duration  12 weeks    PT Treatment/Interventions  ADLs/Self Care Home Management;Biofeedback;Traction;Moist Heat;Electrical Stimulation;Functional mobility training;Neuromuscular re-education;Therapeutic exercise;Therapeutic activities;Patient/family education;Manual techniques;Dry needling;Passive range of motion;Scar mobilization;Taping;Spinal Manipulations;Joint Manipulations    PT Next Visit Plan  up-grade deep-core and posterior pelvic tilts to modified boat and pilates bridges. Add isolated glute. Do MFR to B anterio-lateral hips and HS/posterior thigh PRN, review squat form/functional movement training, deepen squat if appropriate, watch pt. on a treadmill to verify implementing suggestions correctly.    PT Home Exercise Plan  ergonomics, seated posture, posterior pelvic tilts (seated and supine), side-stretch, heel-lift, bladder irritants, fluid intake, urge suppression, low back stretch, hip-flexor stretch, piriformis stretch, posterior tilts with pillow, tools, arch crunches, toe scrunches, pedicure toe separators, foot release with ball, calf stretch, soda-can theory, sit-to-stand, supine to sit, standing and walking posture/mechanics, squat form, adductor and HS stretches    Consulted and Agree with Plan of Care  Patient       Patient will benefit from skilled therapeutic intervention in order to improve the following deficits and impairments:  Improper body mechanics, Pain, Decreased coordination, Decreased scar mobility, Increased muscle spasms, Postural dysfunction, Decreased endurance, Decreased range of motion, Impaired flexibility,  Obesity  Visit Diagnosis: Sacrococcygeal disorders, not elsewhere classified  Other muscle spasm  Abnormal posture     Problem List Patient Active Problem List   Diagnosis Date Noted  . Gall stones 12/10/2013   Willa Rough DPT, ATC Willa Rough 04/03/2020, 9:10 AM  [place] MAIN [facility] SERVICES [address] [place], [place], [address] Phone: [phone]   Fax:  [phone]  Name: Gloria Baker MRN: [id number] Date of Birth: [birth year]

## 2020-04-19 ENCOUNTER — Other Ambulatory Visit: Payer: Self-pay

## 2020-04-19 ENCOUNTER — Ambulatory Visit: Payer: No Typology Code available for payment source

## 2020-04-19 DIAGNOSIS — R293 Abnormal posture: Secondary | ICD-10-CM

## 2020-04-19 DIAGNOSIS — M533 Sacrococcygeal disorders, not elsewhere classified: Secondary | ICD-10-CM

## 2020-04-19 DIAGNOSIS — M62838 Other muscle spasm: Secondary | ICD-10-CM

## 2020-04-19 NOTE — Therapy (Signed)
[place] [facility] MAIN [facility] SERVICES [address] [place], [place], [address] Phone: [phone]   Fax:  [phone]  Physical Therapy Treatment  The patient has been informed of current processes in place at Outpatient Rehab to protect patients from Covid-19 exposure including social distancing, schedule modifications, and new cleaning procedures. After discussing their particular risk with a therapist based on the patient's personal risk factors, the patient has decided to proceed with in-person therapy.   Patient Details  Name: Gloria Baker MRN: [id number] Date of Birth: [birth year] No data recorded  Encounter Date: 04/19/2020  PT End of Session - 04/19/20 0932    Visit Number  11    Number of Visits  16    Date for PT Re-Evaluation  04/02/20    Authorization Type  Aetna    Authorization - Visit Number  1    Authorization - Number of Visits  6    Progress Note Due on Visit  16    PT Start Time  0805    PT Stop Time  0915    PT Time Calculation (min)  70 min    Activity Tolerance  Patient tolerated treatment well;No increased pain    Behavior During Therapy  WFL for tasks assessed/performed       Past Medical History:  Diagnosis Date  . GERD (gastroesophageal reflux disease)   . Hypertension   . Irritable bowel syndrome     Past Surgical History:  Procedure Laterality Date  . ABDOMINAL HYSTERECTOMY    . CHOLECYSTECTOMY  01/06/2014  . COLONOSCOPY W/ BIOPSIES  May 30, 2011   random biopsies showed no evidence of colitis. [name], M.D.  . COLONOSCOPY WITH PROPOFOL N/A 10/17/2016   Procedure: COLONOSCOPY WITH PROPOFOL;  Surgeon: Christena Deem, MD;  Location: [facility] ENDOSCOPY;  Service: Endoscopy;  Laterality: N/A;  . ESOPHAGOGASTRODUODENOSCOPY (EGD) WITH PROPOFOL N/A 10/17/2016   Procedure: ESOPHAGOGASTRODUODENOSCOPY (EGD) WITH PROPOFOL;  Surgeon: Christena Deem, MD;  Location: [facility] ENDOSCOPY;  Service: Endoscopy;   Laterality: N/A;  . PARTIAL HYSTERECTOMY  1996  . UPPER GI ENDOSCOPY  May 30, 2011   Biopsy showed no malignancy  or active ulcer disease. Negative H. pylori. [name], M.D.    There were no vitals filed for this visit.    Pelvic Floor Physical Therapy Treatment Note  SCREENING  Changes in medications, allergies, or medical history?: none    SUBJECTIVE  Patient reports: Was still going frequently but had one day that went well and realized that she had not taken her medicine that day and one of them is a diuretic. Has stopped taking the hydrochlorothiazide and her BP is still fine. Had one day where her pain increased out of nowhere and was on the left instead of the right (Saturday) then skipped her exercises a couple days and then it was easing off until she did her exercises again on Monday and her pain increased a lot. Has not done exercises since since that seemed to really irritate it. Having to push up off the RLE. Bed mobility hurt. Has still done LB stretch.   Precautions:  none  Pain update:  Location of pain: L LBP  Current pain: 4-5/10  Max pain: 8/10  Least pain: 0/10 Nature of pain:achy  **2-3 following treatment  Patient Goals: Not have to run to the bathroom all the time, be able to sit through an hour meeting comfortably. Be able to go on car-trips without fear.  OBJECTIVE  Changes in: Posture/Observations:  Anterior pelvic tilt, L anterior rotation.  Range of Motion/Flexibilty:  R ER/IR 80/40, L ER/IR 90/25 HS ~ 90 deg. B but tightness felt in L calf/hip flexor and R HS during stretch (from previous session)  Strength/MMT:  LE MMT:  L hip flexion 3+/ 5, R hip flex:4/5, B knee flex, 4+/5, L Eversion: 4+/5, R Inversion: 4+/5, all others. B  Hip IR 3+/5, ER 4/5. (from prior session)  Today: Pt. Demonstrates decreased stability in L SLS >R pre-treatment,   -Improved stability in SLS following treatment.  Pelvic Floor (from  3-15) [External Exam: Introitus Appears: WNL Skin integrity: WNL Palpation: no TTP Cough: intact Prolapse visible?: yes with bearing down Scar mobility: needs further assessment  Internal Vaginal Exam: Strength (PERF): 4-/5 Symmetry: greatest TTP at L coccygeus Palpation: TTP throughout Prolapse: posterior wall visible ~ 1 cm above the level of the introitus]  Abdominal:   Palpation: TTP to L Iliacus and QL  Gait Analysis: Pt. Exhibits decreased control over descent of L foot going from heel strike into stance phase, not engaging core throughout gait cycle. (from previous session)  INTERVENTIONS THIS SESSION: Manual: Performed TP release and STM to L Iliacus and QL  to decrease spasm and pain and allow for improved balance of musculature for improved function and decreased symptoms.  Therex: Reviewed which stretches will help maintain pelvic alignment and how strengthening and wearing her heel-lift will help maintain correct pelvic alignment. Educated on how to perform self TP release to continue to decrease spasm and pain in the L hip that were a result of the malalignment.   Total time: 70 min.                   Trigger Point Dry Needling - 04/19/20 0001    Consent Given?  Yes    Education Handout Provided  No    Muscles Treated Back/Hip  Iliacus;Quadratus lumborum    Dry Needling Comments  Left    Iliacus Response  Twitch response elicited;Palpable increased muscle length    Quadratus Lumborum Response  Twitch response elicited;Palpable increased muscle length             PT Short Term Goals - 04/03/20 0741      PT SHORT TERM GOAL #1   Title  Patient will demonstrate a coordinated contraction, relaxation, and bulge of the pelvic floor muscles to demonstrate functional recruitment and motion and allow for further strengthening.    Baseline  Pt. history suggests poor PFM relaxation and contraction coordination    Time  8    Period  Weeks     Status  Achieved    Target Date  03/20/20      PT SHORT TERM GOAL #2   Title  Patient will demonstrate improved pelvic alignment and balance of musculature surrounding the pelvis to facilitate decreased PFM spasms and decrease pelvic pain.    Baseline  anterior pelvic tilt, RLE long by ~ 1 cm. spasms surrounding R>L, posterior>anterior pelvis    Time  5    Period  Weeks    Status  Achieved    Target Date  02/28/20      PT SHORT TERM GOAL #3   Title  Patient will demonstrate improved sitting and standing posture to demonstrate learning and decrease stress on the pelvic floor with functional activity.    Baseline  "prop-sitting" due to 4'11" and anterior pelvic tilt.    Time  5  Period  Weeks    Status  Achieved    Target Date  02/28/20      PT SHORT TERM GOAL #4   Title  Pt. will demonstrate effective use of urge-suppression tecnique at least 80% of the time to demonstrate improved PFM coordination and recruitment and allow for further reduction of UI.    Baseline  Pt. urinating as frequently as every 15 min. in the am. and up to 13+ times per day.    Time  5    Period  Weeks    Status  Achieved    Target Date  02/28/20        PT Long Term Goals - 04/03/20 0743      PT LONG TERM GOAL #1   Title  Patient will report no episodes of SUI or UUI over the course of the prior two weeks to demonstrate improved functional ability.    Baseline  having UUI 13+ times/day and SUI with heavy cough/sneeze fit.    Time  10    Period  Weeks    Status  Achieved    Target Date  04/03/20      PT LONG TERM GOAL #2   Title  Patient will describe pain no greater than 1/10 during walking for at least 30 min. to demonstrate improved functional ability and allow for increased activity for weight loss and overall health..    Baseline  Pain increases to 4/10 with activity.    Time  10    Period  Weeks    Status  Achieved    Target Date  04/03/20      PT LONG TERM GOAL #3   Title  Patient will  report urinating 6-8 times per day over the course of the prior week to demonstrate decreased frequency.    Baseline  Pt. is urinating 13+ times per day, unable to sit through a one-hour meeting without going to the restroom. As of 5/4: Pt going 9-12 times per day    Time  10    Period  Days    Status  On-going    Target Date  06/26/20      PT LONG TERM GOAL #4   Title  Pt. will improve in FOTO score by 10 points to demonstrate improved function.    Baseline  FOTO PFDI Urinary: 25, Urinary problem: 57 As of 5/4: PFDI Urinary: 4 (21 pts) Urinary Problem: 75 (18pts)    Time  10    Period  Weeks    Status  Achieved    Target Date  04/03/20      PT LONG TERM GOAL #5   Title  Patient will demonstrate HEP x1 in the clinic without cueing to demonstrate understanding and proper form to allow for further improvement following D/C.    Baseline  Pt. lacks knowledge of long-term strengthening program for sustained Sx. reduction    Time  12    Period  Weeks    Status  New    Target Date  06/26/20      Additional Long Term Goals   Additional Long Term Goals  Yes      PT LONG TERM GOAL #6   Title  To not have LBP greater than 2/10 after ~ 6 "active" hours over the course of the day. e.g disney and gardeing etc.    Baseline  pain increases to 4/10 with a full day of gardening/activity    Time  12  Period  Weeks    Status  New    Target Date  06/26/20            Plan - 04/19/20 0933    Clinical Impression Statement  Pt. Responded well to all interventions today, demonstrating decreased spasm and pain, improved pelvic alignment and stability in SLS as well as understanding and correct performance of all education and exercises provided today. They will continue to benefit from skilled physical therapy to work toward remaining goals and maximize function as well as decrease likelihood of symptom increase or recurrence.     PT Next Visit Plan  up-grade deep-core and posterior pelvic tilts  to modified boat and pilates bridges. Add isolated glute. Do MFR to B anterio-lateral hips and HS/posterior thigh PRN, review squat form/functional movement training, deepen squat if appropriate, watch pt. on a treadmill to verify implementing suggestions correctly.    PT Home Exercise Plan  ergonomics, seated posture, posterior pelvic tilts (seated and supine), side-stretch, heel-lift, bladder irritants, fluid intake, urge suppression, low back stretch, hip-flexor stretch, piriformis stretch, posterior tilts with pillow, tools, arch crunches, toe scrunches, pedicure toe separators, foot release with ball, calf stretch, soda-can theory, sit-to-stand, supine to sit, standing and walking posture/mechanics, squat form, adductor and HS stretches, self TP release    Consulted and Agree with Plan of Care  Patient       Patient will benefit from skilled therapeutic intervention in order to improve the following deficits and impairments:     Visit Diagnosis: Sacrococcygeal disorders, not elsewhere classified  Other muscle spasm  Abnormal posture     Problem List Patient Active Problem List   Diagnosis Date Noted  . Gall stones 12/10/2013   Cleophus Molt DPT, ATC Cleophus Molt 04/19/2020, 9:35 AM  [place] [facility] MAIN [facility] SERVICES [address] [place], [place], [address] Phone: [phone]   Fax:  [phone]  Name: Gloria Baker MRN: [id number] Date of Birth: [birth year]

## 2020-04-19 NOTE — Patient Instructions (Signed)
    This is The QL muscle  To perform release on this muscle, start by getting into this position by bridging the hips up and then slowly lowering your back, then your butt down to lengthen the low back then put the ball under you where you feel the tender spot and roll to the same side slightly to add pressure as needed. Hold still and take deep breaths until the pain is at least 50% less or, ideally, just pressure.   This is your piriformis    To release this muscle start in this position with your ankle crossed over the opposite knee. Place the tennis ball under your buttock where the tender spot is and then slightly roll your weight to the same side to put just enough pressure that it is uncomfortable. Hold and take deep breaths until the pain is at least 50% less or, ideally ,just pressure.   This is your Gluteus Medius and Minimus  To perform release on this muscle, start by getting into this position by bridging the hips up and then slowly lowering your back, then your butt down to lengthen the low back then put the ball under you where you feel the tender spot and roll to the same side slightly to add pressure as needed. Hold still and take deep breaths until the pain is at least 50% less or, ideally, just pressure.   These are your deep hip-flexor muscles. They are easiest to reach where they come together at the hip.   To perform release on this muscle, start by getting into this position by laying on your stomach then put the ball under you where you feel the tender spot and bring the opposite knee up/out to the side to add pressure as needed. Hold still and take deep breaths until the pain is at least 50% less or, ideally ,just pressure.   

## 2020-05-03 ENCOUNTER — Ambulatory Visit: Payer: No Typology Code available for payment source | Attending: Urology

## 2020-05-03 ENCOUNTER — Other Ambulatory Visit: Payer: Self-pay

## 2020-05-03 DIAGNOSIS — M533 Sacrococcygeal disorders, not elsewhere classified: Secondary | ICD-10-CM | POA: Diagnosis not present

## 2020-05-03 DIAGNOSIS — M62838 Other muscle spasm: Secondary | ICD-10-CM | POA: Insufficient documentation

## 2020-05-03 DIAGNOSIS — R293 Abnormal posture: Secondary | ICD-10-CM | POA: Diagnosis present

## 2020-05-03 NOTE — Therapy (Signed)
[facility] MAIN [facility] SERVICES [address] [place], [place], [address] Phone: [phone]   Fax:  [phone]  Physical Therapy Treatment  The patient has been informed of current processes in place at Outpatient Rehab to protect patients from Covid-19 exposure including social distancing, schedule modifications, and new cleaning procedures. After discussing their particular risk with a therapist based on the patient's personal risk factors, the patient has decided to proceed with in-person therapy.   Patient Details  Name: Gloria Baker MRN: [id number] Date of Birth: December 03, 1970 No data recorded  Encounter Date: 05/03/2020  PT End of Session - 05/03/20 1319    Visit Number  12    Number of Visits  16    Date for PT Re-Evaluation  04/02/20    Authorization Type  [employer]    Authorization - Visit Number  2    Authorization - Number of Visits  6    Progress Note Due on Visit  16    PT Start Time  0800    PT Stop Time  0900    PT Time Calculation (min)  60 min    Activity Tolerance  Patient tolerated treatment well;No increased pain    Behavior During Therapy  WFL for tasks assessed/performed       Past Medical History:  Diagnosis Date  . GERD (gastroesophageal reflux disease)   . Hypertension   . Irritable bowel syndrome     Past Surgical History:  Procedure Laterality Date  . ABDOMINAL HYSTERECTOMY    . CHOLECYSTECTOMY  01/06/2014  . COLONOSCOPY W/ BIOPSIES  May 30, 2011   random biopsies showed no evidence of colitis. [name], M.D.  . COLONOSCOPY WITH PROPOFOL N/A 10/17/2016   Procedure: COLONOSCOPY WITH PROPOFOL;  Surgeon: Christena Deem, MD;  Location: [facility] ENDOSCOPY;  Service: Endoscopy;  Laterality: N/A;  . ESOPHAGOGASTRODUODENOSCOPY (EGD) WITH PROPOFOL N/A 10/17/2016   Procedure: ESOPHAGOGASTRODUODENOSCOPY (EGD) WITH PROPOFOL;  Surgeon: Christena Deem, MD;  Location: [facility] ENDOSCOPY;  Service: Endoscopy;   Laterality: N/A;  . PARTIAL HYSTERECTOMY  1996  . UPPER GI ENDOSCOPY  May 30, 2011   Biopsy showed no malignancy  or active ulcer disease. Negative H. pylori. [name], M.D.    There were no vitals filed for this visit.   Pelvic Floor Physical Therapy Treatment Note  SCREENING  Changes in medications, allergies, or medical history?: none    SUBJECTIVE  Patient reports: Used the tennis ball release a couple days later after last session. It took ~ 9 days total for pain to resolve after last flare up but is doing well most of the time, just some soreness in L LB occasionally.  Precautions:  none  Pain update:  Location of pain: L LBP  Current pain: 4-5/10  Max pain: 8/10  Least pain: 0/10 Nature of pain:achy  **0/10 following treatment.  Patient Goals: Not have to run to the bathroom all the time, be able to sit through an hour meeting comfortably. Be able to go on car-trips without fear.   OBJECTIVE  Changes in: Posture/Observations:  Hyperlordosis, obliquity not assessed   Range of Motion/Flexibilty:  R ER/IR 80/40, L ER/IR 90/25 HS ~ 90 deg. B but tightness felt in L calf/hip flexor and R HS during stretch (from previous session)  Strength/MMT:  LE MMT:  L hip flexion 3+/ 5, R hip flex:4/5, B knee flex, 4+/5, L Eversion: 4+/5, R Inversion: 4+/5, all others. B  Hip IR 3+/5, ER 4/5. (  from prior session)  Pelvic Floor (from 3-15) [External Exam: Introitus Appears: WNL Skin integrity: WNL Palpation: no TTP Cough: intact Prolapse visible?: yes with bearing down Scar mobility: needs further assessment  Internal Vaginal Exam: Strength (PERF): 4-/5 Symmetry: greatest TTP at L coccygeus Palpation: TTP throughout Prolapse: posterior wall visible ~ 1 cm above the level of the introitus]  Abdominal:  Diaphragm spasm increasing difficulty with obliques recruitment.  ** resolved following treatment.  Palpation: TTP to L Iliacus and QL  Gait  Analysis: Pt. Exhibits decreased control over descent of L foot going from heel strike into stance phase, not engaging core throughout gait cycle. (from previous session)  INTERVENTIONS THIS SESSION:  Therex: Reviewed mini-marches which caused pain on L pre-treatment but no pain following treatment and increased obliques activation improved efficacy of HEP.  Manual: performed TP release to mild diaphragm spasm on L to improve obliques recruitment. Performed TP release to L Psoas, lumbar paraspinals, and lumbar multifidus, to allow for improved deep-core recruitment without increased pain.  Dry-needle: Performed TPDN with a .30x180mm needle and standard approach as described below to decrease spasm and pain and allow for improved balance of musculature for improved function and decreased symptoms.  Self-care: Educated on doing posterior pelvic tilt to allow for increased success with pre-squeeze and sneeze as well as urge-suppression and doing pelvic tilts/POP     Total time: 60 min.                     Trigger Point Dry Needling - 05/03/20 0001    Consent Given?  Yes    Education Handout Provided  No    Muscles Treated Back/Hip  Erector spinae    Dry Needling Comments  left    Erector spinae Response  Twitch response elicited;Palpable increased muscle length             PT Short Term Goals - 04/03/20 0741      PT SHORT TERM GOAL #1   Title  Patient will demonstrate a coordinated contraction, relaxation, and bulge of the pelvic floor muscles to demonstrate functional recruitment and motion and allow for further strengthening.    Baseline  Pt. history suggests poor PFM relaxation and contraction coordination    Time  8    Period  Weeks    Status  Achieved    Target Date  03/20/20      PT SHORT TERM GOAL #2   Title  Patient will demonstrate improved pelvic alignment and balance of musculature surrounding the pelvis to facilitate decreased PFM spasms and  decrease pelvic pain.    Baseline  anterior pelvic tilt, RLE long by ~ 1 cm. spasms surrounding R>L, posterior>anterior pelvis    Time  5    Period  Weeks    Status  Achieved    Target Date  02/28/20      PT SHORT TERM GOAL #3   Title  Patient will demonstrate improved sitting and standing posture to demonstrate learning and decrease stress on the pelvic floor with functional activity.    Baseline  "prop-sitting" due to 4'11" and anterior pelvic tilt.    Time  5    Period  Weeks    Status  Achieved    Target Date  02/28/20      PT SHORT TERM GOAL #4   Title  Pt. will demonstrate effective use of urge-suppression tecnique at least 80% of the time to demonstrate improved PFM coordination and recruitment and allow  for further reduction of UI.    Baseline  Pt. urinating as frequently as every 15 min. in the am. and up to 13+ times per day.    Time  5    Period  Weeks    Status  Achieved    Target Date  02/28/20        PT Long Term Goals - 04/03/20 0743      PT LONG TERM GOAL #1   Title  Patient will report no episodes of SUI or UUI over the course of the prior two weeks to demonstrate improved functional ability.    Baseline  having UUI 13+ times/day and SUI with heavy cough/sneeze fit.    Time  10    Period  Weeks    Status  Achieved    Target Date  04/03/20      PT LONG TERM GOAL #2   Title  Patient will describe pain no greater than 1/10 during walking for at least 30 min. to demonstrate improved functional ability and allow for increased activity for weight loss and overall health..    Baseline  Pain increases to 4/10 with activity.    Time  10    Period  Weeks    Status  Achieved    Target Date  04/03/20      PT LONG TERM GOAL #3   Title  Patient will report urinating 6-8 times per day over the course of the prior week to demonstrate decreased frequency.    Baseline  Pt. is urinating 13+ times per day, unable to sit through a one-hour meeting without going to the  restroom. As of 5/4: Pt going 9-12 times per day    Time  10    Period  Days    Status  On-going    Target Date  06/26/20      PT LONG TERM GOAL #4   Title  Pt. will improve in FOTO score by 10 points to demonstrate improved function.    Baseline  FOTO PFDI Urinary: 25, Urinary problem: 57 As of 5/4: PFDI Urinary: 4 (21 pts) Urinary Problem: 75 (18pts)    Time  10    Period  Weeks    Status  Achieved    Target Date  04/03/20      PT LONG TERM GOAL #5   Title  Patient will demonstrate HEP x1 in the clinic without cueing to demonstrate understanding and proper form to allow for further improvement following D/C.    Baseline  Pt. lacks knowledge of long-term strengthening program for sustained Sx. reduction    Time  12    Period  Weeks    Status  New    Target Date  06/26/20      Additional Long Term Goals   Additional Long Term Goals  Yes      PT LONG TERM GOAL #6   Title  To not have LBP greater than 2/10 after ~ 6 "active" hours over the course of the day. e.g disney and gardeing etc.    Baseline  pain increases to 4/10 with a full day of gardening/activity    Time  12    Period  Weeks    Status  New    Target Date  06/26/20            Plan - 05/03/20 1319    Clinical Impression Statement  Pt. Responded well to all interventions today, demonstrating improved deep-core recruitment and resolution of  LBP as well as understanding and correct performance of all education and exercises provided today. They will continue to benefit from skilled physical therapy to work toward remaining goals and maximize function as well as decrease likelihood of symptom increase or recurrence.    PT Next Visit Plan  up-grade deep-core and posterior pelvic tilts to modified boat and pilates bridges. Add isolated glute. Do MFR to B anterio-lateral hips and HS/posterior thigh PRN, review squat form/functional movement training, deepen squat if appropriate, watch pt. on a treadmill to verify  implementing suggestions correctly.    PT Home Exercise Plan  ergonomics, seated posture, posterior pelvic tilts (seated and supine), side-stretch, heel-lift, bladder irritants, fluid intake, urge suppression, low back stretch, hip-flexor stretch, piriformis stretch, posterior tilts with pillow, tools, arch crunches, toe scrunches, pedicure toe separators, foot release with ball, calf stretch, soda-can theory, sit-to-stand, supine to sit, standing and walking posture/mechanics, squat form, adductor and HS stretches, self TP release, pre-squeeze and sneeze    Consulted and Agree with Plan of Care  Patient       Patient will benefit from skilled therapeutic intervention in order to improve the following deficits and impairments:     Visit Diagnosis: Sacrococcygeal disorders, not elsewhere classified  Other muscle spasm  Abnormal posture     Problem List Patient Active Problem List   Diagnosis Date Noted  . Gall stones 12/10/2013   Cleophus Molt DPT, ATC Cleophus Molt 05/03/2020, 1:28 PM  [place] [facility] MAIN [facility] SERVICES [address] [place], [place], [address] Phone: [phone]   Fax:  [phone]  Name: Gloria Baker MRN: [id number] Date of Birth: July 09, 1971

## 2020-05-24 ENCOUNTER — Ambulatory Visit: Payer: No Typology Code available for payment source

## 2020-05-24 ENCOUNTER — Other Ambulatory Visit: Payer: Self-pay

## 2020-05-24 DIAGNOSIS — M533 Sacrococcygeal disorders, not elsewhere classified: Secondary | ICD-10-CM | POA: Diagnosis not present

## 2020-05-24 DIAGNOSIS — M62838 Other muscle spasm: Secondary | ICD-10-CM

## 2020-05-24 DIAGNOSIS — R293 Abnormal posture: Secondary | ICD-10-CM

## 2020-05-24 NOTE — Therapy (Addendum)
[place] MAIN [facility] SERVICES [address] [place], [place], [address] Phone: [phone]   Fax:  [phone]  Physical Therapy Treatment and Discharge Summary  The patient has been informed of current processes in place at Outpatient Rehab to protect patients from Covid-19 exposure including social distancing, schedule modifications, and new cleaning procedures. After discussing their particular risk with a therapist based on the patient's personal risk factors, the patient has decided to proceed with in-person therapy.   Patient Details  Name: RENIKA SHIFLET MRN: [id number] Date of Birth: 01-10-1971 No data recorded  Encounter Date: 05/24/2020   PT End of Session - 05/24/20 0828    Visit Number 13    Number of Visits 16    Date for PT Re-Evaluation 04/02/20    Authorization Type Aetna    Authorization - Visit Number 3    Authorization - Number of Visits 6    Progress Note Due on Visit 16    PT Start Time 0800    PT Stop Time 0900    PT Time Calculation (min) 60 min    Activity Tolerance Patient tolerated treatment well;No increased pain    Behavior During Therapy WFL for tasks assessed/performed           Past Medical History:  Diagnosis Date  . GERD (gastroesophageal reflux disease)   . Hypertension   . Irritable bowel syndrome     Past Surgical History:  Procedure Laterality Date  . ABDOMINAL HYSTERECTOMY    . CHOLECYSTECTOMY  01/06/2014  . COLONOSCOPY W/ BIOPSIES  May 30, 2011   random biopsies showed no evidence of colitis. Loistine Simas, M.D.  . COLONOSCOPY WITH PROPOFOL N/A 10/17/2016   Procedure: COLONOSCOPY WITH PROPOFOL;  Surgeon: Lollie Sails, MD;  Location: [facility] ENDOSCOPY;  Service: Endoscopy;  Laterality: N/A;  . ESOPHAGOGASTRODUODENOSCOPY (EGD) WITH PROPOFOL N/A 10/17/2016   Procedure: ESOPHAGOGASTRODUODENOSCOPY (EGD) WITH PROPOFOL;  Surgeon: Lollie Sails, MD;  Location: [facility] ENDOSCOPY;  Service:  Endoscopy;  Laterality: N/A;  . PARTIAL HYSTERECTOMY  1996  . UPPER GI ENDOSCOPY  May 30, 2011   Biopsy showed no malignancy  or active ulcer disease. Negative H. pylori. Loistine Simas, M.D.    There were no vitals filed for this visit.  Pelvic Floor Physical Therapy Treatment Note  SCREENING  Changes in medications, allergies, or medical history?: none    SUBJECTIVE  Patient reports: Had a great week at [facility]. She did ~ 15000 steps a day and her feet didn't hurt as much. Did her stretching. Everyone noticed how much better she was doing as far as urination. Still gets urgency but not leaking. Has only had leakage rarely if she sneezes really hard when sick etc. Is able to delay to second signal. LBP only increased when the walked to a pool that was further away from their room and she had no heel-lift.  Precautions:  none  Pain update:  Location of pain: L LBP  Current pain: 0/10  Max pain: 3/10  Least pain: 0/10 Nature of pain:achy  **0/10 following treatment.  Patient Goals: Not have to run to the bathroom all the time, be able to sit through an hour meeting comfortably. Be able to go on car-trips without fear.   OBJECTIVE  Changes in: Posture/Observations:  Hyperlordosis decreased but still present. Pt. Aware and is actively un-locking knees to improve core recruitment.   Range of Motion/Flexibilty:  R ER/IR 80/40, L ER/IR 90/25 HS ~ 90 deg. B  but tightness felt in L calf/hip flexor and R HS during stretch (from previous session)  Strength/MMT:  LE MMT:  L hip flexion 3+/ 5, R hip flex:4/5, B knee flex, 4+/5, L Eversion: 4+/5, R Inversion: 4+/5, all others. B  Hip IR 3+/5, ER 4/5. (from prior session)  Pelvic Floor [External Exam: Introitus Appears: WNL Skin integrity: WNL Palpation: no TTP Cough: parodoxical until cued Prolapse visible?: not attempted Scar mobility: WNL  Internal Vaginal Exam: Strength (PERF): 4+/5, 10 sec. 1 time Symmetry:  greatest TTP at L coccygeus Palpation: TTP through  OI, coccygeus, and anterior PR/PC B Prolapse: felt with cough but not visible at rest.  Abdominal:  Diaphragm spasm increasing difficulty with obliques recruitment.  ** resolved following treatment.  Palpation: TTP to L Iliacus and QL  Gait Analysis: Pt. Exhibits decreased control over descent of L foot going from heel strike into stance phase, not engaging core throughout gait cycle. (from previous session)  INTERVENTIONS THIS SESSION:  Therex: Discussed how Pt. Can use the pool for core and hip strengthening without over-engaging the back. Educated on and practiced quick flick and long-hold Kegel's and reviewed the importance of continuing stretches as well to maintain both length and increased strength.  Manual: re-assessed PFM and found some spasms through OI, coccygeus, and anterior PR/PC B which released quickly. 4/5 strength pre-treatment and 4+/5 following. Pt. Demonstrated paradoxical contraction of the PFM with cough but was able to minimize pressure ~ 50% when cued to squeeze just the PFM before cough.   Self-care: Reviewed goals and successes and determined Pt. Is ready for D/C. Educated on potential to use poise impressa as a POP support if she finds that she has a hard time with specific events such as if she were to wear heels or go to a water park where she may not be wearing shoes/heel-lift.   Total time: 60 min.                              PT Short Term Goals - 05/24/20 1730      PT SHORT TERM GOAL #1   Title Patient will demonstrate a coordinated contraction, relaxation, and bulge of the pelvic floor muscles to demonstrate functional recruitment and motion and allow for further strengthening.    Baseline Pt. history suggests poor PFM relaxation and contraction coordination    Time 8    Period Weeks    Status Achieved    Target Date 03/20/20      PT SHORT TERM GOAL #2   Title Patient  will demonstrate improved pelvic alignment and balance of musculature surrounding the pelvis to facilitate decreased PFM spasms and decrease pelvic pain.    Baseline anterior pelvic tilt, RLE long by ~ 1 cm. spasms surrounding R>L, posterior>anterior pelvis    Time 5    Period Weeks    Status Achieved    Target Date 02/28/20      PT SHORT TERM GOAL #3   Title Patient will demonstrate improved sitting and standing posture to demonstrate learning and decrease stress on the pelvic floor with functional activity.    Baseline "prop-sitting" due to 4'11" and anterior pelvic tilt.    Time 5    Period Weeks    Status Achieved    Target Date 02/28/20      PT SHORT TERM GOAL #4   Title Pt. will demonstrate effective use of urge-suppression tecnique at least 80% of  the time to demonstrate improved PFM coordination and recruitment and allow for further reduction of UI.    Baseline Pt. urinating as frequently as every 15 min. in the am. and up to 13+ times per day.    Time 5    Period Weeks    Status Achieved    Target Date 02/28/20             PT Long Term Goals - 05/24/20 0806      PT LONG TERM GOAL #1   Title Patient will report no episodes of SUI or UUI over the course of the prior two weeks to demonstrate improved functional ability.    Baseline having UUI 13+ times/day and SUI with heavy cough/sneeze fit.    Time 10    Period Weeks    Status Achieved    Target Date 04/03/20      PT LONG TERM GOAL #2   Title Patient will describe pain no greater than 1/10 during walking for at least 30 min. to demonstrate improved functional ability and allow for increased activity for weight loss and overall health..    Baseline Pain increases to 4/10 with activity.    Time 10    Period Weeks    Status Achieved    Target Date 04/03/20      PT LONG TERM GOAL #3   Title Patient will report urinating 6-8 times per day over the course of the prior week to demonstrate decreased frequency.     Baseline Pt. is urinating 13+ times per day, unable to sit through a one-hour meeting without going to the restroom. As of 5/4: Pt going 9-12 times per day    Time 10    Period Days    Status Achieved    Target Date 06/26/20      PT LONG TERM GOAL #4   Title Pt. will improve in FOTO score by 10 points to demonstrate improved function.    Baseline FOTO PFDI Urinary: 25, Urinary problem: 57 As of 5/4: PFDI Urinary: 4 (21 pts) Urinary Problem: 73 (16pts) FOTO PFDI Urinary: 25, Urinary problem: 57 As of 6/24: PFDI Urinary: 4 (21 pts) Urinary Problem: 75 (18pts)    Time 10    Period Weeks    Status Achieved    Target Date 04/03/20      PT LONG TERM GOAL #5   Title Patient will demonstrate HEP x1 in the clinic without cueing to demonstrate understanding and proper form to allow for further improvement following D/C.    Baseline Pt. lacks knowledge of long-term strengthening program for sustained Sx. reduction    Time 12    Period Weeks    Status Achieved    Target Date 06/26/20      PT LONG TERM GOAL #6   Title To not have LBP greater than 2/10 after ~ 6 "active" hours over the course of the day. e.g disney and gardeing etc.    Baseline pain increases to 4/10 with a full day of gardening/activity As of 6/24: pain only increased to 3/10 once while in [place] and only when heel-lift not in place.    Time 12    Period Weeks    Status Achieved    Target Date 06/26/20                 Plan - 05/24/20 1327    Clinical Impression Statement Pt. has met all goals and has been able to go on her family  vacation to [facility] without singnificant pain or urinary frequency. She responded well to all interventions today, demonstrating quick release of the spasms that were noted in the PFM and increased ability to recruit the PFM for kegel's following release as well as understanding on how to use her HEP to continue to see improvement and prevent return of spasms/pain. We will D/C at this time to her  HEP.    PT Next Visit Plan D/C    PT Home Exercise Plan ergonomics, seated posture, posterior pelvic tilts (seated and supine), side-stretch, heel-lift, bladder irritants, fluid intake, urge suppression, low back stretch, hip-flexor stretch, piriformis stretch, posterior tilts with pillow, tools, arch crunches, toe scrunches, pedicure toe separators, foot release with ball, calf stretch, soda-can theory, sit-to-stand, supine to sit, standing and walking posture/mechanics, squat form, adductor and HS stretches, self TP release, pre-squeeze and sneeze, Kegel's, water hip strengthening and core strengthening.    Consulted and Agree with Plan of Care Patient           Patient will benefit from skilled therapeutic intervention in order to improve the following deficits and impairments:     Visit Diagnosis: Sacrococcygeal disorders, not elsewhere classified  Other muscle spasm  Abnormal posture     Problem List Patient Active Problem List   Diagnosis Date Noted  . Gall stones 12/10/2013   Willa Rough DPT, ATC Willa Rough 05/24/2020, 5:41 PM  [place] MAIN [facility] SERVICES [address] [place], [place], [address] Phone: [phone]   Fax:  [phone]  Name: ALINAH SHEARD MRN: [id number] Date of Birth: 1971/06/28

## 2020-05-24 NOTE — Patient Instructions (Addendum)
Kegel exercises:    With neutral spine, tighten pelvic floor by imagining you are stopping the flow of urine, squeezing only around the vagina and anus.  Quick-Flicks: Pull up and in quickly and then relax allowing just enough time for the muscles to full lengthen before the next contraction. Do _10__ repetitions in a row, stopping if the pelvic floor muscle gets tired and other muscles try to take over.  Long-Holds: Hold for _8__ seconds and then fully release, repeat _5__ times. (only hold until you can feel the muscles "give up") (work up to 10 sec.x10 times)  Repeat both of these exercises _2-3__ times throughout the day     If this is difficult, start by performing while lying down as shown. As you get stronger, you can try in sitting, standing, etc. Make sure that you are NOT contracting your buttocks or inner thigh muscles!  Tip: If you are having a hard time feeling the muscles, try using a hand-held mirror to observe the muscles lift and draw in or feel with your hand to make sure you are squeezing when you think you are squeezing, not bearing down. Some people find it helpful to pretend you are "picking blueberries" with the vagina to feel the drawing in or try to make the clitoris "nod" and pull your sitz bones in toward the middle.    Focus on water dumbell press-down (crunches) and leg-swings to the side and back, make sure you exhale/engage your core with everything so you do NOT use your low back by accident.   Look into [employer] for disposable temporary prolapse support if needed (waterpark)

## 2020-07-09 ENCOUNTER — Ambulatory Visit: Payer: Self-pay | Admitting: Urology

## 2020-07-23 ENCOUNTER — Ambulatory Visit: Payer: No Typology Code available for payment source | Admitting: Urology

## 2020-07-23 ENCOUNTER — Encounter: Payer: Self-pay | Admitting: Urology

## 2020-07-23 ENCOUNTER — Other Ambulatory Visit: Payer: Self-pay

## 2020-07-23 VITALS — BP 133/90 | HR 80

## 2020-07-23 DIAGNOSIS — R350 Frequency of micturition: Secondary | ICD-10-CM | POA: Diagnosis not present

## 2020-07-23 NOTE — Progress Notes (Signed)
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 [place],  [place] [address]  Chief Complaint  Patient presents with  . Over Active Bladder    HPI: Was consulted to assist the patient is worsening frequency and urgency. After fluid she can void every 15 minutes. Depending on fluid she may be allowed for 2 hours. She has rare incontinence with urgency. She does better sitting. If she had a bad cold she can leak with coughing and sneezing wearing a pad but otherwise no pads  She failed 2 medications including Myrbetriq  She reports a good flow and has no nocturia. She feels empty. No pelvic pain    Grade 1 hypermobility the bladder neck and no stress incontinence with mild cough Skin: No rashes, bruises or suspicious lesions.  She has mixed symptoms but primarily significant frequency. I mention urodynamics. I gave her oxybutynin ER 10 mg and Detrol LA 4 mg. By history she failed Myrbetriq and Vesicare. We likely will performcystoscopy next visit and she was made aware of this. No blood in urine today but urine sent for culture. She held off on physical therapy consultation   Tolterodine helped urgency and frequency by a reasonable percentage Cystoscopy: Normal   recommended behavioral therapy with physical therapy and tolterodine.  I would order urodynamics if she was going to pursue something like InterStim or Botox  The diary and does drink sodas and coffee and this was discussed.  Physical therapy consultation given.  Go back on tolterodine.  Reevaluate in 6 months  Today Epi helped enormously.  Frequency and nocturia are dramatically better.  No incontinence.  Clinically not infected.  Very pleased on tolterodine.  PMH: Past Medical History:  Diagnosis Date  . GERD (gastroesophageal reflux disease)   . Hypertension   . Irritable bowel syndrome     Surgical  History: Past Surgical History:  Procedure Laterality Date  . ABDOMINAL HYSTERECTOMY    . CHOLECYSTECTOMY  01/06/2014  . COLONOSCOPY W/ BIOPSIES  May 30, 2011   random biopsies showed no evidence of colitis. [name], M.D.  . COLONOSCOPY WITH PROPOFOL N/A 10/17/2016   Procedure: COLONOSCOPY WITH PROPOFOL;  Surgeon: Christena Deem, MD;  Location: [facility] ENDOSCOPY;  Service: Endoscopy;  Laterality: N/A;  . ESOPHAGOGASTRODUODENOSCOPY (EGD) WITH PROPOFOL N/A 10/17/2016   Procedure: ESOPHAGOGASTRODUODENOSCOPY (EGD) WITH PROPOFOL;  Surgeon: Christena Deem, MD;  Location[facility] ENDOSCOPY;  Service: Endoscopy;  Laterality: N/A;  . PARTIAL HYSTERECTOMY  1996  . UPPER GI ENDOSCOPY  May 30, 2011   Biopsy showed no malignancy  or active ulcer disease. Negative H. pylori. [name], M.D.    Home Medications:  Allergies as of 07/23/2020   No Known Allergies     Medication List       Accurate as of July 23, 2020  8:42 AM. If you have any questions, ask your nurse or doctor.        STOP taking these medications   hydrochlorothiazide 12.5 MG tablet Commonly known as: HYDRODIURIL Stopped by: Martina Sinner, MD   oxybutynin 10 MG 24 hr tablet Commonly known as: DITROPAN-XL Stopped by: Martina Sinner, MD   SACCHAROMYCES BOULARDII PO Stopped by: Martina Sinner, MD     TAKE these medications   atenolol 25 MG tablet Commonly known as: TENORMIN Take 25 mg by mouth daily.   B-COMPLEX PO Take 1 tablet by mouth daily.  esomeprazole 40 MG capsule Commonly known as: NEXIUM Take 40 mg by mouth daily at 12 noon.   loratadine-pseudoephedrine 5-120 MG tablet Commonly known as: CLARITIN-D 12-hour Take 1 tablet by mouth daily.   multivitamin with minerals Tabs tablet Take 1 tablet by mouth daily.   tolterodine 4 MG 24 hr capsule Commonly known as: Detrol LA Take 1 capsule (4 mg total) by mouth daily.       Allergies: No Known Allergies  Family  History: Family History  Problem Relation Age of Onset  . Hypertension Father   . Diabetes Mother   . Breast cancer Neg Hx     Social History:  reports that she quit smoking about 32 years ago. She quit after 2.00 years of use. She has never used smokeless tobacco. She reports that she does not drink alcohol and does not use drugs.  ROS:                                        Physical Exam: BP 133/90   Pulse 80   Constitutional:  Alert and oriented, No acute distress.   Laboratory Data: Lab Results  Component Value Date   WBC 11.8 (H) 10/06/2013   HGB 13.3 10/06/2013   HCT 40.1 10/06/2013   MCV 83.2 10/06/2013   PLT 330 10/06/2013    Lab Results  Component Value Date   CREATININE 0.55 10/06/2013    No results found for: PSA  No results found for: TESTOSTERONE  No results found for: HGBA1C  Urinalysis    Component Value Date/Time   APPEARANCEUR Cloudy (A) 01/09/2020 0911   GLUCOSEU Negative 01/09/2020 0911   BILIRUBINUR Negative 01/09/2020 0911   PROTEINUR Negative 01/09/2020 0911   NITRITE Negative 01/09/2020 0911   LEUKOCYTESUR Negative 01/09/2020 0911    Pertinent Imaging:   Assessment & Plan: Tolterodine prescription renewed.  She might try to stop it.  If she still wanted she will see me a year.  Prescription sent  There are no diagnoses linked to this encounter.  No follow-ups on file.  Martina Sinner, MD  [facility] [address], [address] [place], [place] [address] [phone]

## 2020-10-22 ENCOUNTER — Other Ambulatory Visit: Payer: Self-pay | Admitting: Family Medicine

## 2020-10-22 DIAGNOSIS — Z1231 Encounter for screening mammogram for malignant neoplasm of breast: Secondary | ICD-10-CM

## 2020-12-06 ENCOUNTER — Ambulatory Visit
Admission: RE | Admit: 2020-12-06 | Discharge: 2020-12-06 | Disposition: A | Discharge status: Home / Self Care | Payer: No Typology Code available for payment source | Source: Ambulatory Visit | Attending: Family Medicine | Admitting: Family Medicine

## 2020-12-06 ENCOUNTER — Other Ambulatory Visit: Payer: Self-pay

## 2020-12-06 DIAGNOSIS — Z1231 Encounter for screening mammogram for malignant neoplasm of breast: Secondary | ICD-10-CM | POA: Diagnosis present

## 2021-07-27 IMAGING — MG DIGITAL SCREENING BILAT W/ TOMO W/ CAD
8 series · 8 of 24 positions shown · non-contrast
Comparison: Previous exam(s).

ACR Breast Density Category a: The breast tissue is almost entirely
fatty.

CLINICAL DATA: Screening.

EXAM:
DIGITAL SCREENING BILATERAL MAMMOGRAM WITH TOMO AND CAD

[R MLO synth-2D]
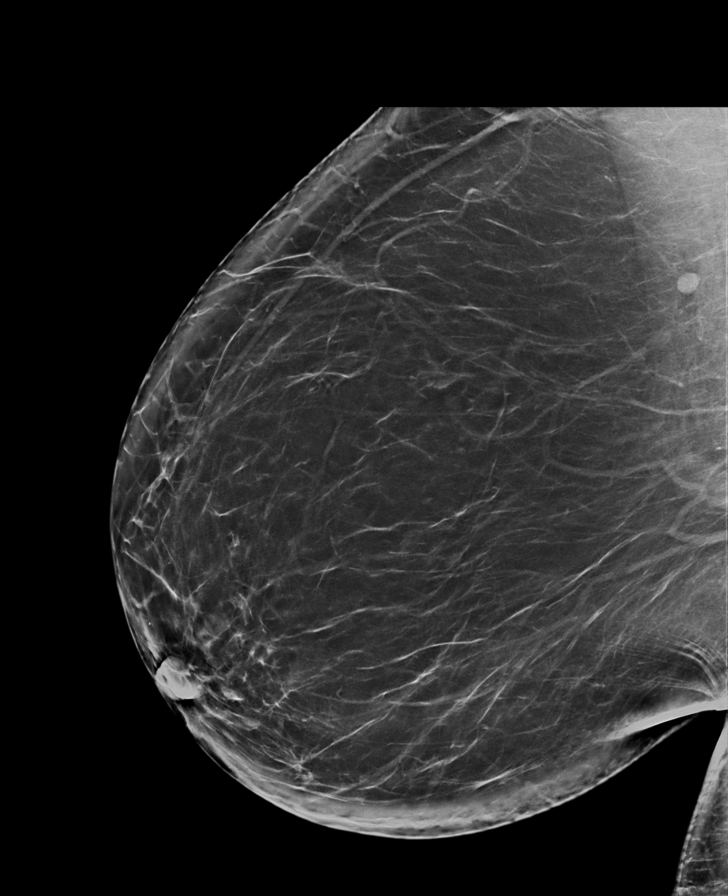

[L MLO synth-2D]
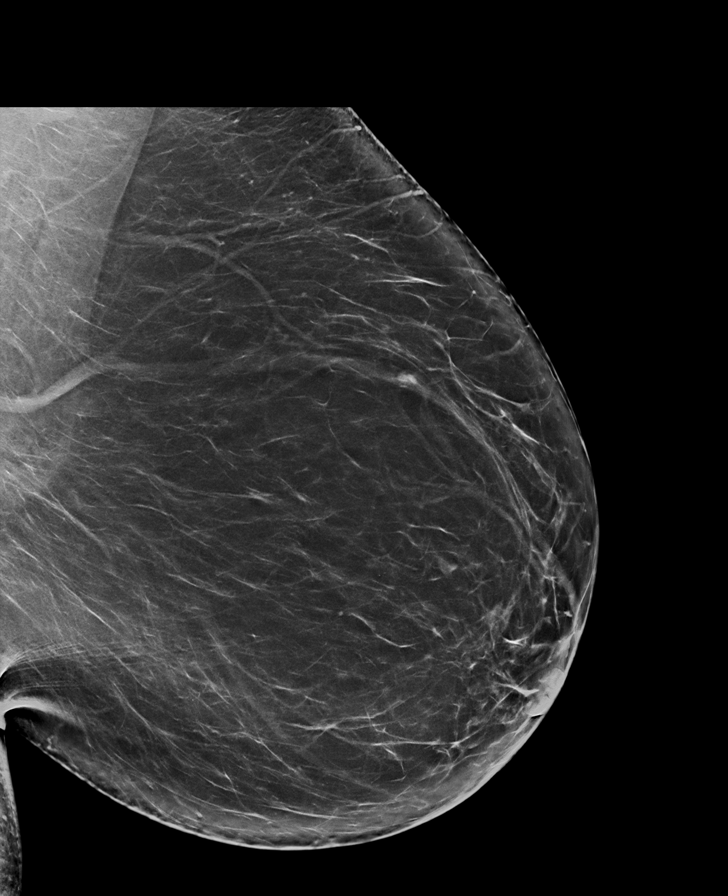

[R CC synth-2D]
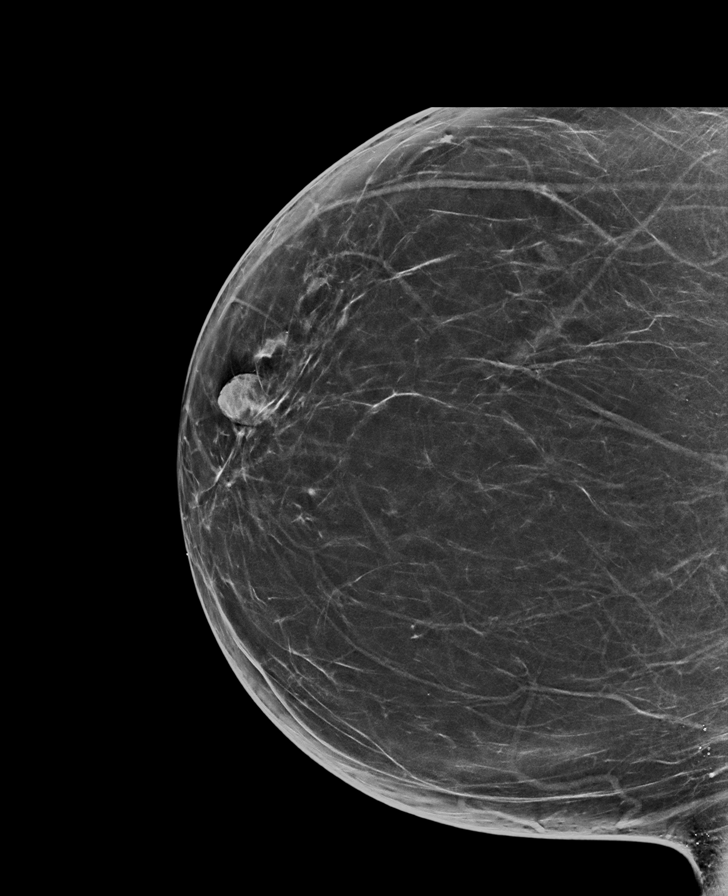

[L CC synth-2D]
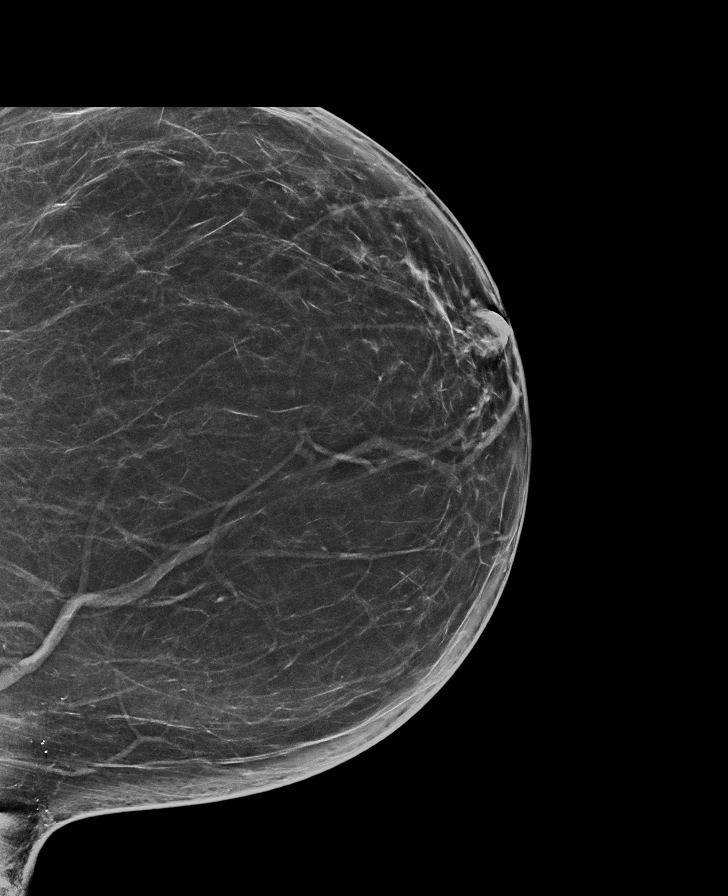

[R MLO tomo · tomo slice 47/93.0]
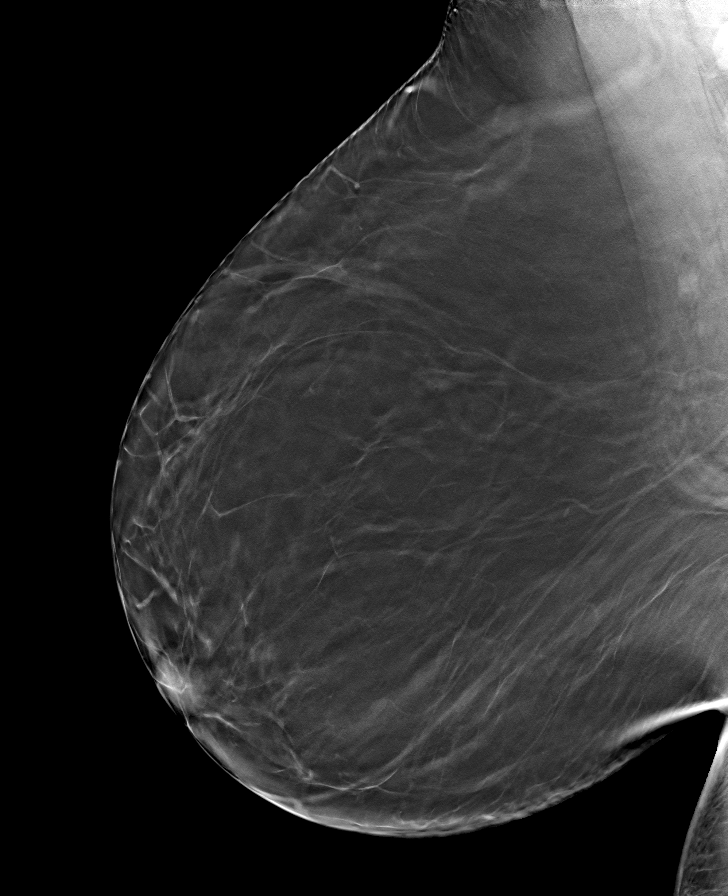

[L MLO tomo · tomo slice 48/95.0]
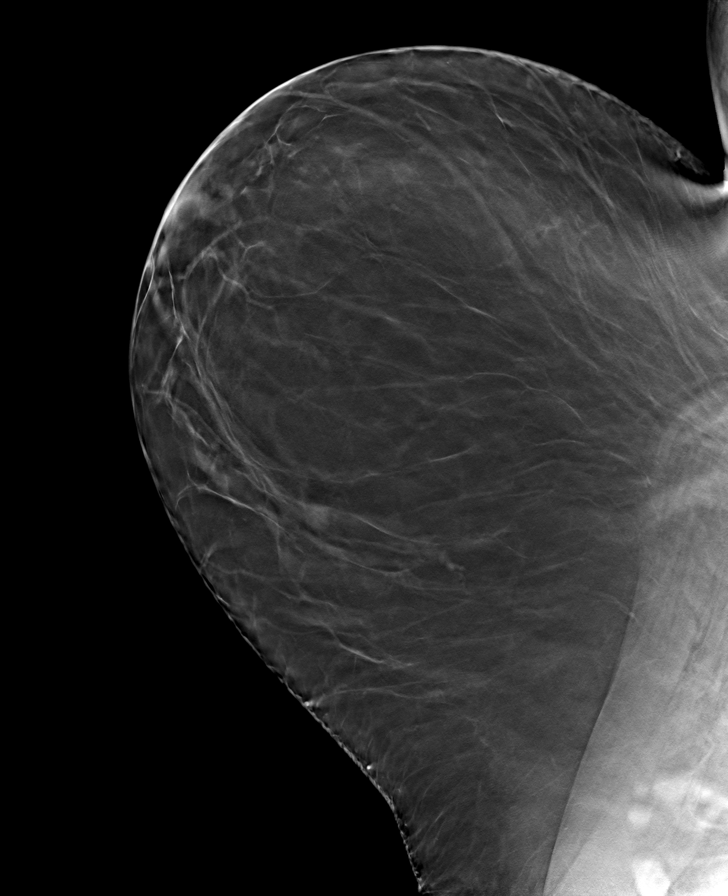

[L CC tomo · tomo slice 42/83.0]
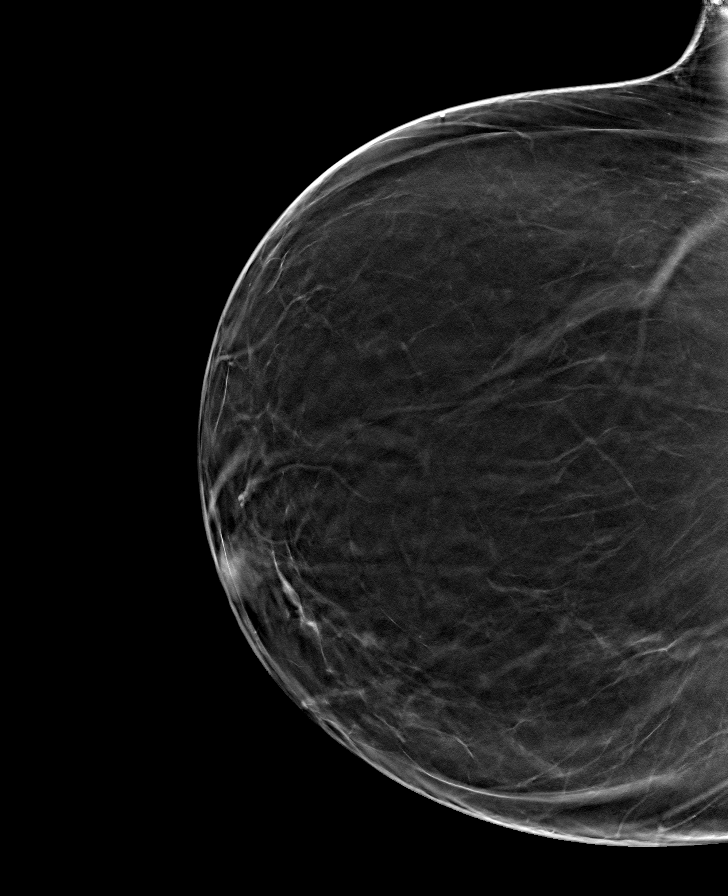

[R CC tomo · tomo slice 41/81.0]
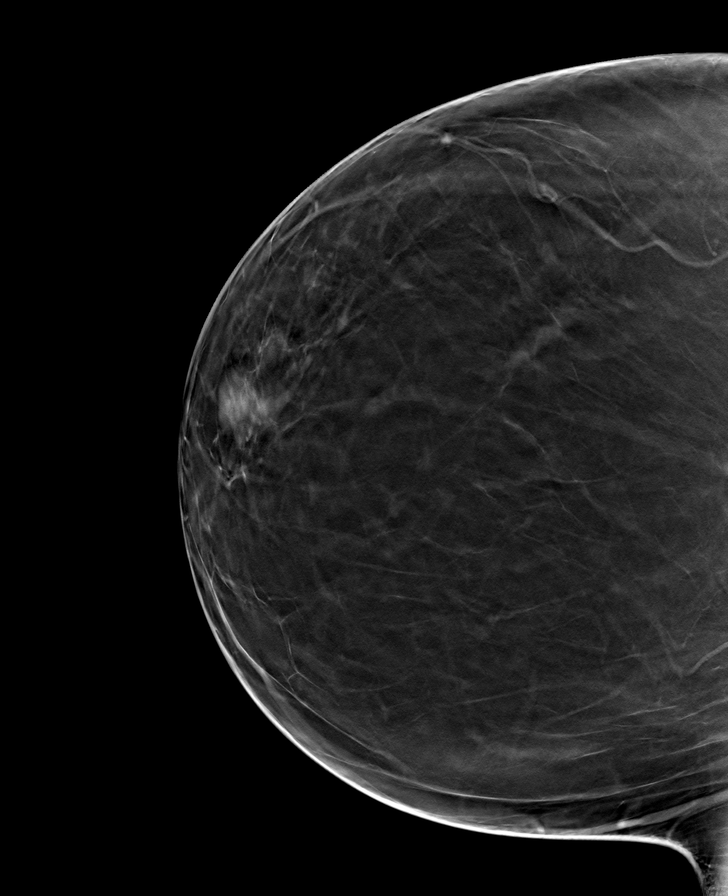

[8 of 24 positions shown; findings below may reference images not displayed]

FINDINGS: There are no findings suspicious for malignancy. Images were
processed with CAD.
IMPRESSION: No mammographic evidence of malignancy. A result letter of this
screening mammogram will be mailed directly to the patient.

RECOMMENDATION:
Screening mammogram in one year. (Code:[id number])

BI-RADS CATEGORY  1: Negative.

## 2021-07-29 ENCOUNTER — Ambulatory Visit: Payer: Self-pay | Admitting: Urology

## 2022-07-28 ENCOUNTER — Other Ambulatory Visit: Payer: Self-pay | Admitting: Family Medicine

## 2022-07-28 DIAGNOSIS — Z1231 Encounter for screening mammogram for malignant neoplasm of breast: Secondary | ICD-10-CM

## 2022-12-18 ENCOUNTER — Ambulatory Visit
Admission: RE | Admit: 2022-12-18 | Discharge: 2022-12-18 | Disposition: A | Discharge status: Home / Self Care | Payer: No Typology Code available for payment source | Source: Ambulatory Visit | Attending: Family Medicine | Admitting: Family Medicine

## 2022-12-18 DIAGNOSIS — Z1231 Encounter for screening mammogram for malignant neoplasm of breast: Secondary | ICD-10-CM | POA: Insufficient documentation

## 2023-12-25 ENCOUNTER — Other Ambulatory Visit: Payer: Self-pay | Admitting: Family Medicine

## 2023-12-25 DIAGNOSIS — Z1231 Encounter for screening mammogram for malignant neoplasm of breast: Secondary | ICD-10-CM

## 2024-01-14 ENCOUNTER — Ambulatory Visit
Admission: RE | Admit: 2024-01-14 | Discharge: 2024-01-14 | Disposition: A | Discharge status: Home / Self Care | Payer: No Typology Code available for payment source | Source: Ambulatory Visit | Attending: Family Medicine | Admitting: Family Medicine

## 2024-01-14 DIAGNOSIS — Z1231 Encounter for screening mammogram for malignant neoplasm of breast: Secondary | ICD-10-CM | POA: Diagnosis present
